# Patient Record
Sex: Male | Born: 1970 | Race: White | Hispanic: No | Marital: Married | Smoking: Former smoker
Health system: Southern US, Community
[De-identification: ages and names within clinical notes are randomized; demographics above are authoritative.]

## PROBLEM LIST (undated history)

## (undated) DIAGNOSIS — I519 Heart disease, unspecified: Secondary | ICD-10-CM

## (undated) DIAGNOSIS — R030 Elevated blood-pressure reading, without diagnosis of hypertension: Secondary | ICD-10-CM

## (undated) DIAGNOSIS — R109 Unspecified abdominal pain: Secondary | ICD-10-CM

## (undated) DIAGNOSIS — R319 Hematuria, unspecified: Secondary | ICD-10-CM

## (undated) DIAGNOSIS — E119 Type 2 diabetes mellitus without complications: Secondary | ICD-10-CM

## (undated) DIAGNOSIS — R3129 Other microscopic hematuria: Secondary | ICD-10-CM

## (undated) DIAGNOSIS — Z8719 Personal history of other diseases of the digestive system: Secondary | ICD-10-CM

## (undated) DIAGNOSIS — R350 Frequency of micturition: Secondary | ICD-10-CM

## (undated) DIAGNOSIS — G4730 Sleep apnea, unspecified: Secondary | ICD-10-CM

## (undated) DIAGNOSIS — S43439A Superior glenoid labrum lesion of unspecified shoulder, initial encounter: Secondary | ICD-10-CM

## (undated) DIAGNOSIS — Z87442 Personal history of urinary calculi: Secondary | ICD-10-CM

## (undated) HISTORY — DX: Superior glenoid labrum lesion of unspecified shoulder, initial encounter: S43.439A

## (undated) HISTORY — PX: TONSILLECTOMY: SUR1361

## (undated) HISTORY — DX: Type 2 diabetes mellitus without complications: E11.9

## (undated) HISTORY — DX: Other microscopic hematuria: R31.29

## (undated) HISTORY — PX: OTHER SURGICAL HISTORY: SHX169

## (undated) HISTORY — DX: Frequency of micturition: R35.0

## (undated) HISTORY — PX: NASAL SINUS SURGERY: SHX719

## (undated) HISTORY — DX: Personal history of urinary calculi: Z87.442

## (undated) HISTORY — DX: Elevated blood-pressure reading, without diagnosis of hypertension: R03.0

## (undated) HISTORY — DX: Heart disease, unspecified: I51.9

## (undated) HISTORY — DX: Unspecified abdominal pain: R10.9

## (undated) HISTORY — PX: SHOULDER SURGERY: SHX246

## (undated) HISTORY — DX: Sleep apnea, unspecified: G47.30

## (undated) HISTORY — DX: Hematuria, unspecified: R31.9

---

## 2007-02-22 ENCOUNTER — Ambulatory Visit: Payer: Self-pay | Admitting: Internal Medicine

## 2012-04-10 ENCOUNTER — Ambulatory Visit: Payer: Self-pay | Admitting: Internal Medicine

## 2013-01-02 ENCOUNTER — Ambulatory Visit: Payer: Self-pay | Admitting: Internal Medicine

## 2014-07-30 ENCOUNTER — Ambulatory Visit: Payer: Self-pay | Admitting: Internal Medicine

## 2014-08-05 DIAGNOSIS — S43439A Superior glenoid labrum lesion of unspecified shoulder, initial encounter: Secondary | ICD-10-CM | POA: Insufficient documentation

## 2014-08-05 DIAGNOSIS — M65812 Other synovitis and tenosynovitis, left shoulder: Secondary | ICD-10-CM | POA: Insufficient documentation

## 2014-08-05 HISTORY — DX: Superior glenoid labrum lesion of unspecified shoulder, initial encounter: S43.439A

## 2014-08-15 ENCOUNTER — Ambulatory Visit: Payer: Self-pay | Admitting: Surgery

## 2014-09-07 ENCOUNTER — Emergency Department
Admit: 2014-09-07 | Disposition: A | Discharge status: Home / Self Care | Payer: Self-pay | Admitting: Emergency Medicine

## 2014-09-07 LAB — COMPREHENSIVE METABOLIC PANEL
ALBUMIN: 4.6 g/dL
ANION GAP: 11 (ref 7–16)
Alkaline Phosphatase: 45 U/L
BILIRUBIN TOTAL: 0.7 mg/dL
BUN: 15 mg/dL
Calcium, Total: 9.7 mg/dL
Chloride: 101 mmol/L
Co2: 27 mmol/L
Creatinine: 1.06 mg/dL
EGFR (Non-African Amer.): >60
Glucose: 146 mg/dL — ABNORMAL HIGH
Potassium: 3.8 mmol/L
SGOT(AST): 35 U/L
SGPT (ALT): 41 U/L
Sodium: 139 mmol/L
TOTAL PROTEIN: 7.8 g/dL

## 2014-09-07 LAB — CBC WITH DIFFERENTIAL/PLATELET
Basophil #: 0.1 10*3/uL (ref 0.0–0.1)
Basophil %: 0.7 %
EOS ABS: 0.3 10*3/uL (ref 0.0–0.7)
EOS PCT: 3.1 %
HCT: 43.2 % (ref 40.0–52.0)
HGB: 15.3 g/dL (ref 13.0–18.0)
Lymphocyte #: 3.2 10*3/uL (ref 1.0–3.6)
Lymphocyte %: 33.7 %
MCH: 33.1 pg (ref 26.0–34.0)
MCHC: 35.4 g/dL (ref 32.0–36.0)
MCV: 94 fL (ref 80–100)
MONO ABS: 0.7 x10 3/mm (ref 0.2–1.0)
Monocyte %: 6.9 %
NEUTROS PCT: 55.6 %
Neutrophil #: 5.3 10*3/uL (ref 1.4–6.5)
PLATELETS: 205 10*3/uL (ref 150–440)
RBC: 4.61 10*6/uL (ref 4.40–5.90)
RDW: 12.9 % (ref 11.5–14.5)
WBC: 9.5 10*3/uL (ref 3.8–10.6)

## 2014-09-07 LAB — TROPONIN I: Troponin-I: <0.03 ng/mL

## 2014-09-07 LAB — URINALYSIS, COMPLETE
BACTERIA: NONE SEEN
BILIRUBIN, UR: NEGATIVE
Blood: NEGATIVE
GLUCOSE, UR: NEGATIVE mg/dL (ref 0–75)
Ketone: NEGATIVE
LEUKOCYTE ESTERASE: NEGATIVE
Nitrite: NEGATIVE
Ph: 5 (ref 4.5–8.0)
Protein: NEGATIVE
SQUAMOUS EPITHELIAL: NONE SEEN
Specific Gravity: 1.019 (ref 1.003–1.030)

## 2014-09-07 LAB — LIPASE, BLOOD: LIPASE: 27 U/L

## 2014-09-22 NOTE — Consult Note (Signed)
PATIENT NAME:  Gerald Myers, Gerald Myers MR#:  [id number] DATE OF BIRTH:  June 05, 1970  DATE OF CONSULTATION:  09/07/2014  REFERRING PHYSICIAN:   CONSULTING PHYSICIAN:  Carmie End, MD  BRIEF HISTORY:  Gerald Myers is a 44 year old gentleman seen in the Emergency Room with a 4-day history of abdominal pain and pleuritic chest.  The patient is 3 weeks status post a left shoulder repair by Dr. Joice Lofts from the [facility].  He had a gradual onset of right upper quadrant discomfort starting 4 days ago which worsened over the last 24 hours.  He has had one significant episode of nausea and vomiting.  Otherwise he has been able to eat.  He has normal bowel function.  No other GI symptoms.  He has not had this particular pain before. He has noted increase in his pleuritic discomfort and some mild shortness of breath.  He presented to the Emergency Room for further evaluation.  Workup in the ED with CT of the chest and abdomen demonstrated no evidence for pulmonary embolus or pleural effusion.  He did have what appeared to be some stranding between the stomach and the liver suggestive of possible omental infarction.  Ultrasound demonstrated an echogenic defect consistent with possible gallstone.  There was no evidence of acute cholecystitis.  No sonographic Murphy's sign.  No evidence of any gallbladder wall thickening.  His lab values were largely unremarkable.   He denies any other significant GI history other than the fact that he carries a diagnosis of diverticulitis.  He was diagnosed greater than 10 years ago with diverticulitis and told that he will always have that problem.  I wonder if he did not really mean diverticulosis.  He certainly did not have any history of recent intervention for diverticulitis.  Specifically denies no history of hepatitis, yellow jaundice, pancreatitis, peptic ulcer disease or gallbladder disease.  He has had no previous abdominal surgery.  His other surgeries in  addition to his left shoulder were tonsil and adenoidectomy, wisdom teeth, and nasal surgery.  He is a long-standing diabetic currently on oral medications.  He does not have a history of cardiac disease, hypertension or thyroid problems.   CURRENT MEDICATIONS: Alprazolam 0.5 mg twice a day p.r.n., Bupropion 150 mg twice a day, ibuprofen 200 mg 1-2 tablets every 6 hours p.r.n., oxycodone 5 mg two tablets every 4 to 6 hours p.r.n., metformin 1000 mg once a day, and TriCor 145 mg once a day.   ALLERGIES:  ASTELIN NASAL SPRAY.   SOCIAL HISTORY:  He is a reformed cigarette smoker.  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   FAMILY HISTORY:  Consistent with multiple cancers, diabetes and coronary artery disease.   REVIEW OF SYSTEMS: Otherwise unremarkable.  A 10-point review of systems is carried out and he has no other symptoms than those noted above.  Specifically he denies any palpitations, lightheadedness, or urinary symptoms.   PHYSICAL EXAMINATION:  GENERAL:  He is lying comfortably in bed.  Appears to be without significant pain although has had pain medication.  VITAL SIGNS:  Blood pressure is 135/80.  Heart rate is 78 and regular.  He is afebrile.  HEENT:  Normal eyes.  Normal ears without evidence of scleral icterus or pupillary abnormalities. LYMPH:  No lymphadenitis in his axilla or cervical areas.  CHEST:  Clear bilaterally with no adventitious sounds.  He has normal pulmonary excursion with some pain on deep inspiration.  CARDIAC:  No murmurs or  gallops.  He seems to be in normal sinus rhythm.  ABDOMEN:  Very soft with no significant abdominal tenderness.  He has no rebound, guarding, masses or hernias noted.  EXTREMITIES:  Full range of motion.  Upper extremity exam has significant decreased range of motion in his left arm following his surgery.  NEUROLOGIC:  Compromised by his recent surgery.  SKIN:  No significant lesions, abrasions or  contusions.  PSYCHIATRIC:  Normal orientation, normal affect.    ASSESSMENT AND PLAN:  I have independently reviewed his CT scan and his ultrasound report.  CT scan does show some wispy stranding between the stomach and his liver although I think it is difficult to make the diagnosis of omental infarction in that situation.  He does have a gallstone so some of his symptoms could be related to biliary tract disease.  I think his current symptoms, if related to omental infarction, should be short-lived and self-limited.  Would recommend symptomatic treatment without antibiotic therapy at the present time. Would  recommend followup with [facility], Dr. Daniel NonesBert Klein as there are no significant indications for intervention with omental infarction.  Should he begin to have symptoms we would re-evaluate his gallbladder perhaps with HIDA scan to see if his symptoms are related to his biliary tract.  I have outlined this plan to the patient and his wife and they are in agreement.      ____________________________ Carmie Endalph L. Ely III, MD [phone] D: 09/07/2014 21:29:50 ET T: 09/07/2014 21:57:11 ET JOB#: [id number]  cc: Carmie Endalph L. Ely III, MD, <Dictator> Lynnea FerrierBert J. Klein III, MD Quentin OreALPH L ELY MD ELECTRONICALLY SIGNED 09/08/2014 22:15

## 2014-09-22 NOTE — Op Note (Signed)
PATIENT NAME:  Gerald Myers, Gerald Myers MR#:  [id number] DATE OF BIRTH:  03-13-1971  DATE OF PROCEDURE:  08/15/2014  PREOPERATIVE DIAGNOSIS: Impingement/tendinopathy with superior labrum anterior and posterior tear, left shoulder.   POSTOPERATIVE DIAGNOSIS: Impingement/tendinopathy with superior labrum anterior and posterior tear, left shoulder.   PROCEDURES: Arthroscopic superior labrum anterior and posterior repair, arthroscopic subacromial decompression, and mini-open biceps tenodesis, left shoulder.  SURGEON: Maryagnes Amos, MD  ANESTHESIA: General endotracheal with an interscalene block placed preoperative by the anesthesiologist.   FINDINGS: As noted above. There was a superior labral tear extending from approximately the 11:00 to the 12:30 position with significant degenerative fraying. The rotator cuff itself was in excellent condition, as were the articular surfaces of the glenoid and humerus.   COMPLICATIONS: None.   ESTIMATED BLOOD LOSS: Minimal.  TOTAL FLUIDS: Crystalloid 800 mL.  TOURNIQUET: None.   DRAINS: None.  CLOSURE: Staples.  BRIEF CLINICAL NOTE: The patient is a 44 year old male with a several month history of left shoulder pain. His symptoms have progressed despite medications, activity modification, etc. His history and examination were consistent with an impingement/tendinopathy and SLAP tear, all of which were confirmed by MRI scan. He presents at this time for arthroscopy, debridement, SLAP repair, and decompression.  PROCEDURE: The patient underwent placement of an interscalene block in the preoperative holding area. He was then brought into the operating room and lain in the supine position. After adequate general endotracheal intubation and anesthesia were obtained, he was repositioned in the beach chair position using the beach chair positioner. The left shoulder and upper extremity were prepped with ChloraPrep solution before being draped sterilely.  Preoperative antibiotics were administered. The expected portal sites and incision site were injected with 1% lidocaine with epinephrine before the camera was placed in the posterior portal. The glenohumeral joint was thoroughly inspected with the findings as described above. An anterior portal was created using an outside-in technique. The labrum was carefully probed, again confirming the above-noted findings. Given that we were going to do a SLAP repair, it was felt best to proceed with a biceps tenodesis. Therefore, the biceps was released from its labral attachment using the ArthroCare wand. A moderate amount of reactive inflamed synovial tissues superiorly and anteriorly were debrided using the full radius resector and with the ArthroCare wand. A separate superolateral portal site was created using an outside-in technique in order to facilitate repair of the superior labrum. The exposed glenoid rim was roughened with an end-cutting rasp and full-radius resector down to bleeding bone. The labrum was repaired using a single BioKnotless anchor placed at approximately the 12 o'clock position. Subsequent probing of the repair demonstrated excellent stability. The instruments were removed from the joint after suctioning the excess fluid.   The camera was repositioned through the posterior portal into the subacromial space. A separate lateral portal was created. The full-radius resector was introduced and used to perform a subtotal bursectomy before the ArthroCare wand was introduced to remove the periosteum off the undersurface of the anterior third of the acromion as well as to recess the coracoacromial ligament from its attachment along the anterior and lateral margin of the acromion. The 4 mm acromionizing bur was inserted and used to complete the decompression by removing the undersurface of the anterior third of the acromion. The full radius resector was reintroduced to remove any residual bony debris before  the ArthroCare wand was reintroduced to obtain hemostasis. The instruments were then removed from the subacromial space after suctioning the excess fluid.  The superolateral portal site was extended for a total of approximately 5 cm, parallel to the bicipital groove. The incision was carried down through the subcutaneous tissues to expose the deltoid fascia. The raphe between the anterior and middle thirds was identified and this plane developed to provide access into the subacromial space. The bicipital groove was identified by palpation and opened. The biceps tendon stump was retrieved through this defect. The floor of the bicipital groove was roughened with a curette before a single Biomet 2.9 mm JuggerKnot anchor was inserted. Both sets of sutures were passed through the tendon and tied securely to effect the tenodesis. The bicipital sheath was reapproximated using two #0 Ethibond interrupted sutures, incorporating the biceps tendon to further reinforce the tenodesis.   The wound was copiously irrigated with sterile saline solution before the deltoid raphe was reapproximated using 2-0 Vicryl interrupted sutures. The subcutaneous tissues were closed in two layers using 2-0 Vicryl interrupted sutures before the skin was closed using staples. The portal sites also were closed using staples. A sterile bulky dressing was applied to the shoulder before the arm was placed into a shoulder immobilizer. The patient was then awakened, extubated, and returned to the recovery room in satisfactory condition after tolerating the procedure well.   ____________________________ J. Derald MacleodJeffrey Aalijah Lanphere, MD jjp:ST D: 08/15/2014 11:27:28 ET T: 08/15/2014 23:00:54 ET JOB#: [id number]  cc: Maryagnes AmosJ. Jeffrey Okie Bogacz, MD, <Dictator> Maryagnes AmosJ. JEFFREY Jaydien Panepinto MD ELECTRONICALLY SIGNED 08/20/2014 16:03

## 2014-12-16 DIAGNOSIS — E119 Type 2 diabetes mellitus without complications: Secondary | ICD-10-CM

## 2014-12-16 HISTORY — DX: Type 2 diabetes mellitus without complications: E11.9

## 2015-05-19 ENCOUNTER — Emergency Department
Admission: EM | Admit: 2015-05-19 | Discharge: 2015-05-19 | Disposition: A | Discharge status: Home / Self Care | Payer: Federal, State, Local not specified - PPO | Attending: Emergency Medicine | Admitting: Emergency Medicine

## 2015-05-19 ENCOUNTER — Emergency Department: Payer: Federal, State, Local not specified - PPO

## 2015-05-19 DIAGNOSIS — N50811 Right testicular pain: Secondary | ICD-10-CM | POA: Diagnosis present

## 2015-05-19 DIAGNOSIS — N23 Unspecified renal colic: Secondary | ICD-10-CM | POA: Diagnosis not present

## 2015-05-19 LAB — URINALYSIS COMPLETE WITH MICROSCOPIC (ARMC ONLY)
Bilirubin Urine: NEGATIVE
GLUCOSE, UA: 150 mg/dL — AB
LEUKOCYTES UA: NEGATIVE
NITRITE: NEGATIVE
PROTEIN: 30 mg/dL — AB
SPECIFIC GRAVITY, URINE: 1.029 (ref 1.005–1.030)
pH: 5 (ref 5.0–8.0)

## 2015-05-19 LAB — COMPREHENSIVE METABOLIC PANEL
ALT: 24 U/L (ref 17–63)
ANION GAP: 8 (ref 5–15)
AST: 19 U/L (ref 15–41)
Albumin: 4.4 g/dL (ref 3.5–5.0)
Alkaline Phosphatase: 42 U/L (ref 38–126)
BILIRUBIN TOTAL: 0.6 mg/dL (ref 0.3–1.2)
BUN: 18 mg/dL (ref 6–20)
CO2: 24 mmol/L (ref 22–32)
Calcium: 9 mg/dL (ref 8.9–10.3)
Chloride: 105 mmol/L (ref 101–111)
Creatinine, Ser: 1.15 mg/dL (ref 0.61–1.24)
Glucose, Bld: 249 mg/dL — ABNORMAL HIGH (ref 65–99)
POTASSIUM: 4 mmol/L (ref 3.5–5.1)
Sodium: 137 mmol/L (ref 135–145)
TOTAL PROTEIN: 7.1 g/dL (ref 6.5–8.1)

## 2015-05-19 LAB — CBC
HCT: 39.7 % — ABNORMAL LOW (ref 40.0–52.0)
Hemoglobin: 14.1 g/dL (ref 13.0–18.0)
MCH: 32.9 pg (ref 26.0–34.0)
MCHC: 35.4 g/dL (ref 32.0–36.0)
MCV: 92.9 fL (ref 80.0–100.0)
Platelets: 186 10*3/uL (ref 150–440)
RBC: 4.27 MIL/uL — ABNORMAL LOW (ref 4.40–5.90)
RDW: 13.2 % (ref 11.5–14.5)
WBC: 10 10*3/uL (ref 3.8–10.6)

## 2015-05-19 LAB — LIPASE, BLOOD: Lipase: 17 U/L (ref 11–51)

## 2015-05-19 MED ORDER — ONDANSETRON HCL 4 MG/2ML IJ SOLN
4.0000 mg | Freq: Once | INTRAMUSCULAR | Status: AC | PRN
  Administered 2015-05-19: 4 mg via INTRAVENOUS
  Filled 2015-05-19: qty 2

## 2015-05-19 MED ORDER — MORPHINE SULFATE (PF) 4 MG/ML IV SOLN
INTRAVENOUS | Status: AC
  Administered 2015-05-19: 4 mg via INTRAVENOUS
  Filled 2015-05-19: qty 1

## 2015-05-19 MED ORDER — ONDANSETRON HCL 4 MG PO TABS: 4.0000 mg | ORAL_TABLET | Freq: Every day | ORAL | Status: DC | PRN

## 2015-05-19 MED ORDER — OXYCODONE-ACETAMINOPHEN 5-325 MG PO TABS: 2.0000 | ORAL_TABLET | Freq: Four times a day (QID) | ORAL | Status: DC | PRN

## 2015-05-19 MED ORDER — SODIUM CHLORIDE 0.9 % IV BOLUS (SEPSIS)
1000.0000 mL | Freq: Once | INTRAVENOUS | Status: AC
  Administered 2015-05-19: 1000 mL via INTRAVENOUS

## 2015-05-19 MED ORDER — MORPHINE SULFATE (PF) 4 MG/ML IV SOLN
4.0000 mg | Freq: Once | INTRAVENOUS | Status: AC
  Administered 2015-05-19: 4 mg via INTRAVENOUS

## 2015-05-19 MED ORDER — TAMSULOSIN HCL 0.4 MG PO CAPS: 0.4000 mg | ORAL_CAPSULE | Freq: Every day | ORAL | Status: DC

## 2015-05-19 MED ORDER — HYDROMORPHONE HCL 1 MG/ML IJ SOLN
0.5000 mg | Freq: Once | INTRAMUSCULAR | Status: AC
  Administered 2015-05-19: 0.5 mg via INTRAVENOUS
  Filled 2015-05-19: qty 1

## 2015-05-19 NOTE — Discharge Instructions (Signed)
Kidney Stones °Kidney stones (urolithiasis) are deposits that form inside your kidneys. The intense pain is caused by the stone moving through the urinary tract. When the stone moves, the ureter goes into spasm around the stone. The stone is usually passed in the urine.  °CAUSES  °· A disorder that makes certain neck glands produce too much parathyroid hormone (primary hyperparathyroidism). °· A buildup of uric acid crystals, similar to gout in your joints. °· Narrowing (stricture) of the ureter. °· A kidney obstruction present at birth (congenital obstruction). °· Previous surgery on the kidney or ureters. °· Numerous kidney infections. °SYMPTOMS  °· Feeling sick to your stomach (nauseous). °· Throwing up (vomiting). °· Blood in the urine (hematuria). °· Pain that usually spreads (radiates) to the groin. °· Frequency or urgency of urination. °DIAGNOSIS  °· Taking a history and physical exam. °· Blood or urine tests. °· CT scan. °· Occasionally, an examination of the inside of the urinary bladder (cystoscopy) is performed. °TREATMENT  °· Observation. °· Increasing your fluid intake. °· Extracorporeal shock wave lithotripsy--This is a noninvasive procedure that uses shock waves to break up kidney stones. °· Surgery may be needed if you have severe pain or persistent obstruction. There are various surgical procedures. Most of the procedures are performed with the use of small instruments. Only small incisions are needed to accommodate these instruments, so recovery time is minimized. °The size, location, and chemical composition are all important variables that will determine the proper choice of action for you. Talk to your health care provider to better understand your situation so that you will minimize the risk of injury to yourself and your kidney.  °HOME CARE INSTRUCTIONS  °· Drink enough water and fluids to keep your urine clear or pale yellow. This will help you to pass the stone or stone fragments. °· Strain  all urine through the provided strainer. Keep all particulate matter and stones for your health care provider to see. The stone causing the pain may be as small as a grain of salt. It is very important to use the strainer each and every time you pass your urine. The collection of your stone will allow your health care provider to analyze it and verify that a stone has actually passed. The stone analysis will often identify what you can do to reduce the incidence of recurrences. °· Only take over-the-counter or prescription medicines for pain, discomfort, or fever as directed by your health care provider. °· Keep all follow-up visits as told by your health care provider. This is important. °· Get follow-up X-rays if required. The absence of pain does not always mean that the stone has passed. It may have only stopped moving. If the urine remains completely obstructed, it can cause loss of kidney function or even complete destruction of the kidney. It is your responsibility to make sure X-rays and follow-ups are completed. Ultrasounds of the kidney can show blockages and the status of the kidney. Ultrasounds are not associated with any radiation and can be performed easily in a matter of minutes. °· Make changes to your daily diet as told by your health care provider. You may be told to: °¨ Limit the amount of salt that you eat. °¨ Eat 5 or more servings of fruits and vegetables each day. °¨ Limit the amount of meat, poultry, fish, and eggs that you eat. °· Collect a 24-hour urine sample as told by your health care provider. You may need to collect another urine sample every 6-12   months. °SEEK MEDICAL CARE IF: °· You experience pain that is progressive and unresponsive to any pain medicine you have been prescribed. °SEEK IMMEDIATE MEDICAL CARE IF:  °· Pain cannot be controlled with the prescribed medicine. °· You have a fever or shaking chills. °· The severity or intensity of pain increases over 18 hours and is not  relieved by pain medicine. °· You develop a new onset of abdominal pain. °· You feel faint or pass out. °· You are unable to urinate. °  °This information is not intended to replace advice given to you by your health care provider. Make sure you discuss any questions you have with your health care provider. °  °Document Released: 05/10/2005 Document Revised: 01/29/2015 Document Reviewed: 10/11/2012 °Elsevier Interactive Patient Education ©2016 [employer] ° °

## 2015-05-19 NOTE — ED Provider Notes (Signed)
[facility] Emergency Department Provider Note     Time seen: ----------------------------------------- 7:12 AM on 05/19/2015 -----------------------------------------    I have reviewed the triage vital signs and the nursing notes.   HISTORY  Chief Complaint Back Pain; Emesis; and Testicle Pain    HPI Vara GuardianChristopher W Vivona is a 44 y.o. male 's ER for sudden onset right mid back pain and testicular pain. Patient denies any urinary symptoms, he does have a history kidney stones states this feels similar.Patient states in the past he was able to pass kidney stone, denies any other complaints.   No past medical history on file.  There are no active problems to display for this patient.   No past surgical history on file.  Allergies Review of patient's allergies indicates no known allergies.  Social History Social History  Substance Use Topics  . Smoking status: Not on file  . Smokeless tobacco: Not on file  . Alcohol Use: Not on file    Review of Systems Constitutional: Negative for fever. Eyes: Negative for visual changes. ENT: Negative for sore throat. Cardiovascular: Negative for chest pain. Respiratory: Negative for shortness of breath. Gastrointestinal: Positive right flank pain and nausea Genitourinary: Negative for dysuria. Musculoskeletal: Positive for back pain Skin: Negative for rash. Neurological: Negative for headaches, focal weakness or numbness.  10-point ROS otherwise negative.  ____________________________________________   PHYSICAL EXAM:  VITAL SIGNS: ED Triage Vitals  Enc Vitals Group     BP 05/19/15 0604 140/75 mmHg     Pulse Rate 05/19/15 0604 66     Resp 05/19/15 0604 18     Temp 05/19/15 0604 97.9 F (36.6 C)     Temp Source 05/19/15 0604 Oral     SpO2 05/19/15 0604 98 %     Weight 05/19/15 0604 235 lb (106.595 kg)     Height 05/19/15 0604 5' 6.5" (1.689 m)     Head Cir --      Peak Flow --    Pain Score 05/19/15 0604 9     Pain Loc --      Pain Edu? --      Excl. in GC? --     Constitutional: Alert and oriented. Mild distress Eyes: Conjunctivae are normal. PERRL. Normal extraocular movements. ENT   Head: Normocephalic and atraumatic.   Nose: No congestion/rhinnorhea.   Mouth/Throat: Mucous membranes are moist.   Neck: No stridor. Cardiovascular: Normal rate, regular rhythm. No murmurs, rubs, or gallops. Respiratory: Normal respiratory effort without tachypnea nor retractions. Breath sounds are clear and equal bilaterally. No wheezes/rales/rhonchi. Gastrointestinal: No flank tenderness, no rebound or guarding. Normal bowel sounds. Musculoskeletal: Nontender with normal range of motion in all extremities. No joint effusions.  No lower extremity tenderness nor edema. Neurologic:  Normal speech and language. No gross focal neurologic deficits are appreciated. Speech is normal.  Skin:  Skin is warm, dry and intact. No rash noted. Psychiatric: Mood and affect are normal. Speech and behavior are normal. Patient exhibits appropriate insight and judgment. ____________________________________________  ED COURSE:  Pertinent labs & imaging results that were available during my care of the patient were reviewed by me and considered in my medical decision making (see chart for details). Likely renal colic. Patient received IV Toradol and fluids. ____________________________________________    LABS (pertinent positives/negatives)  Labs Reviewed  COMPREHENSIVE METABOLIC PANEL - Abnormal; Notable for the following:    Glucose, Bld 249 (*)    All other components within normal limits  CBC - Abnormal;  Notable for the following:    RBC 4.27 (*)    HCT 39.7 (*)    All other components within normal limits  URINALYSIS COMPLETEWITH MICROSCOPIC ([facility] ONLY) - Abnormal; Notable for the following:    Color, Urine YELLOW (*)    APPearance HAZY (*)    Glucose, UA 150 (*)     Ketones, ur TRACE (*)    Hgb urine dipstick 3+ (*)    Protein, ur 30 (*)    Bacteria, UA RARE (*)    Squamous Epithelial / LPF 0-5 (*)    All other components within normal limits  LIPASE, BLOOD    RADIOLOGY Images were viewed by me  KUB IMPRESSION: No acute abnormality identified. IMPRESSION: 1. Partially obstructing 7 mm proximal right ureteral calculus. 2. Descending and sigmoid diverticulosis. ____________________________________________  FINAL ASSESSMENT AND PLAN  Renal colic  Plan: Patient with labs and imaging as dictated above. Patient with a large proximal right ureteral stone. He'll be discharged with pain medicine, Flomax and antiemetics. He is stable for outpatient follow-up with his doctor.   Emily Filbert, MD   Emily Filbert, MD 05/19/15 [phone]

## 2015-05-19 NOTE — ED Notes (Signed)
Pt c/o sudden onset of right mid back pain and testicular pain; denies urinary s/s; pt with history of kidney stones and says this feels similar; pt restless in triage

## 2015-05-19 NOTE — ED Notes (Signed)
Pt alert and oriented. NAD. Respirations unlabored. Pain is tolerable but slightly starting to increase. Will attempt to obtain urine. Skin warm dry and pink.

## 2015-05-19 NOTE — ED Notes (Signed)
Patient transported to CT 

## 2015-05-20 ENCOUNTER — Encounter: Payer: Self-pay | Admitting: Urology

## 2015-05-22 ENCOUNTER — Encounter: Payer: Self-pay | Admitting: Urology

## 2015-05-22 ENCOUNTER — Ambulatory Visit (INDEPENDENT_AMBULATORY_CARE_PROVIDER_SITE_OTHER): Payer: Federal, State, Local not specified - PPO | Admitting: Urology

## 2015-05-22 VITALS — BP 141/88 | HR 111 | Ht 67.0 in | Wt 238.9 lb

## 2015-05-22 DIAGNOSIS — R030 Elevated blood-pressure reading, without diagnosis of hypertension: Secondary | ICD-10-CM | POA: Insufficient documentation

## 2015-05-22 DIAGNOSIS — N201 Calculus of ureter: Secondary | ICD-10-CM | POA: Diagnosis not present

## 2015-05-22 DIAGNOSIS — E785 Hyperlipidemia, unspecified: Secondary | ICD-10-CM | POA: Insufficient documentation

## 2015-05-22 DIAGNOSIS — G4730 Sleep apnea, unspecified: Secondary | ICD-10-CM | POA: Insufficient documentation

## 2015-05-22 DIAGNOSIS — R109 Unspecified abdominal pain: Secondary | ICD-10-CM | POA: Diagnosis not present

## 2015-05-22 DIAGNOSIS — R3129 Other microscopic hematuria: Secondary | ICD-10-CM

## 2015-05-22 DIAGNOSIS — F329 Major depressive disorder, single episode, unspecified: Secondary | ICD-10-CM | POA: Insufficient documentation

## 2015-05-22 DIAGNOSIS — F32A Depression, unspecified: Secondary | ICD-10-CM | POA: Insufficient documentation

## 2015-05-22 HISTORY — DX: Other microscopic hematuria: R31.29

## 2015-05-22 HISTORY — DX: Elevated blood-pressure reading, without diagnosis of hypertension: R03.0

## 2015-05-22 HISTORY — DX: Sleep apnea, unspecified: G47.30

## 2015-05-22 HISTORY — DX: Unspecified abdominal pain: R10.9

## 2015-05-22 LAB — MICROSCOPIC EXAMINATION: EPITHELIAL CELLS (NON RENAL): NONE SEEN /HPF (ref 0–10)

## 2015-05-22 LAB — URINALYSIS, COMPLETE
BILIRUBIN UA: NEGATIVE
Glucose, UA: NEGATIVE
Leukocytes, UA: NEGATIVE
NITRITE UA: NEGATIVE
PH UA: 6 (ref 5.0–7.5)
SPEC GRAV UA: 1.025 (ref 1.005–1.030)
Urobilinogen, Ur: 1 mg/dL (ref 0.2–1.0)

## 2015-05-22 MED ORDER — OXYCODONE-ACETAMINOPHEN 5-325 MG PO TABS: 2.0000 | ORAL_TABLET | Freq: Four times a day (QID) | ORAL | Status: DC | PRN

## 2015-05-22 MED ORDER — TAMSULOSIN HCL 0.4 MG PO CAPS: 0.4000 mg | ORAL_CAPSULE | Freq: Every day | ORAL | Status: AC

## 2015-05-22 NOTE — Progress Notes (Signed)
05/22/2015 3:56 PM   Gerald Myers January 20, 1971 [id number]  Referring provider: Adin Hector, MD [address], [place] [address]  Chief Complaint  Patient presents with  . Nephrolithiasis    New Patient    HPI: Gerald Myers is a 44yo seen in consultation for right ureteral calculi. 1 month ago he had dull intermittent right back pain. 3 days ago he awoke to severe, intermittent, sharp, nonradiating right flank pain. He went to the ER and was diagnosed with a 33m R mid ureteral calculi. He is on percocet, flomax. His pain is a 2-3/10. He denies any LUTS.    PMH: Past Medical History  Diagnosis Date  . Heart disease   . Hematuria   . Diabetes ([place]   . Urinary frequency   . History of kidney stones     Surgical History: Past Surgical History  Procedure Laterality Date  . Tonsillectomy    . Tubes in ear    . Shoulder surgery Left   . Nasal sinus surgery      Home Medications:    Medication List       This list is accurate as of: 05/22/15  3:56 PM.  Always use your most recent med list.               ALPRAZolam 0.5 MG tablet  Commonly known as:  XANAX  Take 1 tablet by mouth 2 (two) times daily as needed. Reported on 05/22/2015     buPROPion 150 MG 12 hr tablet  Commonly known as:  WELLBUTRIN SR  Take 1 tablet by mouth 2 (two) times daily.     fenofibrate 145 MG tablet  Commonly known as:  TRICOR  Take 1 tablet by mouth daily.     fluticasone 50 MCG/ACT nasal spray  Commonly known as:  FLONASE  Place 2 sprays into both nostrils daily.     ibuprofen 200 MG tablet  Commonly known as:  ADVIL,MOTRIN  Take 200 mg by mouth every 6 (six) hours as needed.     metFORMIN 1000 MG tablet  Commonly known as:  GLUCOPHAGE  Take 1,000 mg by mouth 2 (two) times daily with a meal.     ondansetron 4 MG tablet  Commonly known as:  ZOFRAN  Take 1 tablet (4 mg total) by mouth daily as needed for nausea or vomiting.     oxyCODONE-acetaminophen 5-325 MG tablet  Commonly known as:  PERCOCET  Take 2 tablets by mouth every 6 (six) hours as needed for moderate pain or severe pain.     tamsulosin 0.4 MG Caps capsule  Commonly known as:  FLOMAX  Take 1 capsule (0.4 mg total) by mouth daily after breakfast.        Allergies: No Known Allergies  Family History: Family History  Problem Relation Age of Onset  . Bladder Cancer Neg Hx   . Prostate cancer Neg Hx   . Kidney cancer Neg Hx   . Nephrolithiasis Father     Social History:  reports that he has been smoking Cigarettes.  He has been smoking about 0.50 packs per day. He does not have any smokeless tobacco history on file. He reports that he drinks alcohol. He reports that he does not use illicit drugs.  ROS: UROLOGY Frequent Urination?: No Hard to postpone urination?: No Burning/pain with urination?: No Get up at night to urinate?: No Leakage of urine?: No Urine stream starts and stops?: No Trouble  starting stream?: No Do you have to strain to urinate?: No Blood in urine?: Yes Urinary tract infection?: No Sexually transmitted disease?: No Injury to kidneys or bladder?: No Painful intercourse?: No Weak stream?: No Erection problems?: No Penile pain?: No  Gastrointestinal Nausea?: No Vomiting?: No Indigestion/heartburn?: No Diarrhea?: No Constipation?: No  Constitutional Fever: No Night sweats?: No Weight loss?: No Fatigue?: No  Skin Skin rash/lesions?: No Itching?: No  Eyes Blurred vision?: No Double vision?: No  Ears/Nose/Throat Sore throat?: No Sinus problems?: No  Hematologic/Lymphatic Swollen glands?: No Easy bruising?: No  Cardiovascular Leg swelling?: No Chest pain?: No  Respiratory Cough?: No Shortness of breath?: No  Endocrine Excessive thirst?: No  Musculoskeletal Back pain?: Yes Joint pain?: No  Neurological Headaches?: No Dizziness?: No  Psychologic Depression?: No Anxiety?:  No  Physical Exam: BP 141/88 mmHg  Pulse 111  Ht '5\' 7"'  (1.702 m)  Wt 108.364 kg (238 lb 14.4 oz)  BMI 37.41 kg/m2  Constitutional:  Alert and oriented, No acute distress. HEENT: [place] AT, moist mucus membranes.  Trachea midline, no masses. Cardiovascular: No clubbing, cyanosis, or edema. Respiratory: Normal respiratory effort, no increased work of breathing. GI: Abdomen is soft, nontender, nondistended, no abdominal masses GU: No CVA tenderness. Skin: No rashes, bruises or suspicious lesions. Lymph: No cervical or inguinal adenopathy. Neurologic: Grossly intact, no focal deficits, moving all 4 extremities. Psychiatric: Normal mood and affect.  Laboratory Data: Lab Results  Component Value Date   WBC 10.0 05/19/2015   HGB 14.1 05/19/2015   HCT 39.7* 05/19/2015   MCV 92.9 05/19/2015   PLT 186 05/19/2015    Lab Results  Component Value Date   CREATININE 1.15 05/19/2015    No results found for: PSA  No results found for: TESTOSTERONE  No results found for: HGBA1C  Urinalysis    Component Value Date/Time   COLORURINE YELLOW* 05/19/2015 0803   COLORURINE Yellow 09/07/2014 1425   APPEARANCEUR HAZY* 05/19/2015 0803   APPEARANCEUR Clear 09/07/2014 1425   LABSPEC 1.029 05/19/2015 0803   LABSPEC 1.019 09/07/2014 1425   PHURINE 5.0 05/19/2015 0803   PHURINE 5.0 09/07/2014 1425   GLUCOSEU 150* 05/19/2015 0803   GLUCOSEU Negative 09/07/2014 1425   HGBUR 3+* 05/19/2015 0803   HGBUR Negative 09/07/2014 1425   BILIRUBINUR NEGATIVE 05/19/2015 0803   BILIRUBINUR Negative 09/07/2014 1425   KETONESUR TRACE* 05/19/2015 0803   KETONESUR Negative 09/07/2014 1425   PROTEINUR 30* 05/19/2015 0803   PROTEINUR Negative 09/07/2014 1425   NITRITE NEGATIVE 05/19/2015 0803   NITRITE Negative 09/07/2014 1425   LEUKOCYTESUR NEGATIVE 05/19/2015 0803   LEUKOCYTESUR Negative 09/07/2014 1425    Pertinent Imaging: CT scan  Assessment & Plan:    1. Calculus of right ureter -MET, rx for  percocet, flomax -RTC 1 week - Urinalysis, Complete   No Follow-up on file.  Cleon Gustin, [facility] [address], [place], [place] [address] [phone]

## 2015-05-29 ENCOUNTER — Ambulatory Visit (INDEPENDENT_AMBULATORY_CARE_PROVIDER_SITE_OTHER): Payer: Federal, State, Local not specified - PPO | Admitting: Urology

## 2015-05-29 ENCOUNTER — Encounter: Payer: Self-pay | Admitting: Urology

## 2015-05-29 VITALS — BP 113/74 | HR 82 | Ht 67.0 in | Wt 238.3 lb

## 2015-05-29 DIAGNOSIS — N201 Calculus of ureter: Secondary | ICD-10-CM | POA: Diagnosis not present

## 2015-05-29 LAB — URINALYSIS, COMPLETE
Bilirubin, UA: NEGATIVE
KETONES UA: NEGATIVE
LEUKOCYTES UA: NEGATIVE
NITRITE UA: NEGATIVE
PROTEIN UA: NEGATIVE
SPEC GRAV UA: 1.025 (ref 1.005–1.030)
Urobilinogen, Ur: 0.2 mg/dL (ref 0.2–1.0)
pH, UA: 5.5 (ref 5.0–7.5)

## 2015-05-29 LAB — MICROSCOPIC EXAMINATION
RBC, UA: >30 /hpf — ABNORMAL HIGH (ref 0–?)
WBC, UA: NONE SEEN /hpf (ref 0–?)

## 2015-05-29 MED ORDER — OXYCODONE-ACETAMINOPHEN 5-325 MG PO TABS: 2.0000 | ORAL_TABLET | Freq: Four times a day (QID) | ORAL | Status: DC | PRN

## 2015-05-29 NOTE — Progress Notes (Signed)
05/29/2015 11:03 AM   Gerald Myers 03-22-1971 [id number]  Referring provider: Lynnea FerrierBert J Klein III, MD [address] [facility] [place], [place] [id number]  Chief Complaint  Patient presents with  . Follow-up    Nephrolithoasis    HPI: Mr Gerald JamesCaviness is a 44yo seen in consultation for right ureteral calculi. 1 month ago he had dull intermittent right back pain. 3 days ago he awoke to severe, intermittent, sharp, nonradiating right flank pain. He went to the ER and was diagnosed with a 5mm R mid ureteral calculi. He is on percocet, flomax. His pain is a 2-3/10. He denies any LUTS. The stone was not visible on KUB.  Interval history:   The patient has done well since his last appointment. He is taking less pain medication. He has not passed the stone. He has no fevers chills or signs of infection.   PMH: Past Medical History  Diagnosis Date  . Heart disease   . Hematuria   . Diabetes (HCC)   . Urinary frequency   . History of kidney stones   . Diabetes mellitus without complication (HCC) 12/16/2014  . Glenoid labral tear 08/05/2014  . Microscopic hematuria 05/22/2015    Overview:  prior history of renal stones. Renal Ultrasound 11/13 unremarkable   . Apnea, sleep 05/22/2015    Overview:  on CPAP   . Elevated blood pressure reading without diagnosis of hypertension 05/22/2015  . Flank pain 05/22/2015    Surgical History: Past Surgical History  Procedure Laterality Date  . Tonsillectomy    . Tubes in ear    . Shoulder surgery Left   . Nasal sinus surgery      Home Medications:    Medication List       This list is accurate as of: 05/29/15 11:03 AM.  Always use your most recent med list.               ALPRAZolam 0.5 MG tablet  Commonly known as:  XANAX  Take 1 tablet by mouth 2 (two) times daily as needed. Reported on 05/22/2015     buPROPion 150 MG 12 hr tablet  Commonly known as:  WELLBUTRIN SR  Take 1 tablet by mouth 2 (two) times daily.       fenofibrate 145 MG tablet  Commonly known as:  TRICOR  Take 1 tablet by mouth daily.     fluticasone 50 MCG/ACT nasal spray  Commonly known as:  FLONASE  Place 2 sprays into both nostrils daily.     ibuprofen 200 MG tablet  Commonly known as:  ADVIL,MOTRIN  Take 200 mg by mouth every 6 (six) hours as needed.     metFORMIN 1000 MG tablet  Commonly known as:  GLUCOPHAGE  Take 1,000 mg by mouth 2 (two) times daily with a meal.     ondansetron 4 MG tablet  Commonly known as:  ZOFRAN  Take 1 tablet (4 mg total) by mouth daily as needed for nausea or vomiting.     oxyCODONE-acetaminophen 5-325 MG tablet  Commonly known as:  PERCOCET  Take 2 tablets by mouth every 6 (six) hours as needed for moderate pain or severe pain.     tamsulosin 0.4 MG Caps capsule  Commonly known as:  FLOMAX  Take 1 capsule (0.4 mg total) by mouth daily after breakfast.        Allergies: No Known Allergies  Family History: Family History  Problem Relation Age of Onset  . Bladder  Cancer Neg Hx   . Prostate cancer Neg Hx   . Kidney cancer Neg Hx   . Nephrolithiasis Father     Social History:  reports that he has been smoking Cigarettes.  He has been smoking about 0.50 packs per day. He does not have any smokeless tobacco history on file. He reports that he drinks alcohol. He reports that he does not use illicit drugs.  ROS: UROLOGY Frequent Urination?: No Hard to postpone urination?: No Burning/pain with urination?: No Get up at night to urinate?: No Leakage of urine?: No Urine stream starts and stops?: No Trouble starting stream?: No Do you have to strain to urinate?: No Blood in urine?: No Urinary tract infection?: No Sexually transmitted disease?: No Injury to kidneys or bladder?: No Painful intercourse?: No Weak stream?: No Erection problems?: No Penile pain?: No  Gastrointestinal Nausea?: No Vomiting?: No Indigestion/heartburn?: No Diarrhea?: No Constipation?:  No  Constitutional Fever: No Night sweats?: No Weight loss?: No Fatigue?: No  Skin Skin rash/lesions?: No Itching?: No  Eyes Blurred vision?: No Double vision?: No  Ears/Nose/Throat Sore throat?: No Sinus problems?: No  Hematologic/Lymphatic Swollen glands?: No Easy bruising?: No  Cardiovascular Leg swelling?: No Chest pain?: No  Respiratory Cough?: No Shortness of breath?: No  Endocrine Excessive thirst?: No  Musculoskeletal Back pain?: Yes Joint pain?: No  Neurological Headaches?: No Dizziness?: No  Psychologic Depression?: No  Physical Exam: BP 113/74 mmHg  Pulse 82  Ht 5\' 7"  (1.702 m)  Wt 238 lb 4.8 oz (108.092 kg)  BMI 37.31 kg/m2  Constitutional:  Alert and oriented, No acute distress. HEENT: [place] AT, moist mucus membranes.  Trachea midline, no masses. Cardiovascular: No clubbing, cyanosis, or edema. Respiratory: Normal respiratory effort, no increased work of breathing. GI: Abdomen is soft, nontender, nondistended, no abdominal masses GU: No CVA tenderness.  Skin: No rashes, bruises or suspicious lesions. Lymph: No cervical or inguinal adenopathy. Neurologic: Grossly intact, no focal deficits, moving all 4 extremities. Psychiatric: Normal mood and affect.  Laboratory Data: Lab Results  Component Value Date   WBC 10.0 05/19/2015   HGB 14.1 05/19/2015   HCT 39.7* 05/19/2015   MCV 92.9 05/19/2015   PLT 186 05/19/2015    Lab Results  Component Value Date   CREATININE 1.15 05/19/2015    No results found for: PSA  No results found for: TESTOSTERONE  No results found for: HGBA1C  Urinalysis    Component Value Date/Time   COLORURINE YELLOW* 05/19/2015 0803   COLORURINE Yellow 09/07/2014 1425   APPEARANCEUR HAZY* 05/19/2015 0803   APPEARANCEUR Clear 09/07/2014 1425   LABSPEC 1.029 05/19/2015 0803   LABSPEC 1.019 09/07/2014 1425   PHURINE 5.0 05/19/2015 0803   PHURINE 5.0 09/07/2014 1425   GLUCOSEU Negative 05/22/2015 1526    GLUCOSEU Negative 09/07/2014 1425   HGBUR 3+* 05/19/2015 0803   HGBUR Negative 09/07/2014 1425   BILIRUBINUR Negative 05/22/2015 1526   BILIRUBINUR NEGATIVE 05/19/2015 0803   BILIRUBINUR Negative 09/07/2014 1425   KETONESUR TRACE* 05/19/2015 0803   KETONESUR Negative 09/07/2014 1425   PROTEINUR 30* 05/19/2015 0803   PROTEINUR Negative 09/07/2014 1425   NITRITE Negative 05/22/2015 1526   NITRITE NEGATIVE 05/19/2015 0803   NITRITE Negative 09/07/2014 1425   LEUKOCYTESUR Negative 05/22/2015 1526   LEUKOCYTESUR NEGATIVE 05/19/2015 0803   LEUKOCYTESUR Negative 09/07/2014 1425     Assessment & Plan:    1. Calculus of right ureter The patient appears to be tolerating medical expulsive therapy. We'll continue this with Flomax,  Percocet, and straining urine. He was instructed to go to the ER where the office if his pain becomes unbearable or he develops a fever over 100.3. He will follow-up in 2 weeks with a renal ultrasound prior as his stone is not visible on KUB.   Return in about 2 weeks (around 06/12/2015) for with renal ultrasound prior.  Hildred Laser, MD  [facility] [address] [place], [place] [address] [phone]

## 2015-06-10 ENCOUNTER — Telehealth: Payer: Self-pay | Admitting: Urology

## 2015-06-10 NOTE — Telephone Encounter (Signed)
Patient called and said that he has 6 more pain pills left and wants more. He has a follow up appt on 06-13-15.  Please have the nurse call him back if he can have more   Thanks  michelle

## 2015-06-11 ENCOUNTER — Ambulatory Visit
Admission: RE | Admit: 2015-06-11 | Discharge: 2015-06-11 | Disposition: A | Discharge status: Home / Self Care | Payer: Federal, State, Local not specified - PPO | Source: Ambulatory Visit | Attending: Urology | Admitting: Urology

## 2015-06-11 DIAGNOSIS — Z87442 Personal history of urinary calculi: Secondary | ICD-10-CM | POA: Diagnosis present

## 2015-06-11 DIAGNOSIS — Z09 Encounter for follow-up examination after completed treatment for conditions other than malignant neoplasm: Secondary | ICD-10-CM | POA: Insufficient documentation

## 2015-06-11 DIAGNOSIS — N201 Calculus of ureter: Secondary | ICD-10-CM

## 2015-06-13 ENCOUNTER — Encounter: Payer: Self-pay | Admitting: Urology

## 2015-06-13 ENCOUNTER — Ambulatory Visit (INDEPENDENT_AMBULATORY_CARE_PROVIDER_SITE_OTHER): Payer: Federal, State, Local not specified - PPO | Admitting: Urology

## 2015-06-13 VITALS — BP 118/76 | HR 86 | Ht 67.0 in | Wt 236.5 lb

## 2015-06-13 DIAGNOSIS — N200 Calculus of kidney: Secondary | ICD-10-CM | POA: Diagnosis not present

## 2015-06-13 LAB — URINALYSIS, COMPLETE
BILIRUBIN UA: NEGATIVE
Ketones, UA: NEGATIVE
Leukocytes, UA: NEGATIVE
Nitrite, UA: NEGATIVE
PROTEIN UA: NEGATIVE
Specific Gravity, UA: 1.025 (ref 1.005–1.030)
UUROB: 0.2 mg/dL (ref 0.2–1.0)
pH, UA: 5.5 (ref 5.0–7.5)

## 2015-06-13 LAB — MICROSCOPIC EXAMINATION
BACTERIA UA: NONE SEEN
EPITHELIAL CELLS (NON RENAL): NONE SEEN /HPF (ref 0–10)
RBC, UA: >30 /hpf — ABNORMAL HIGH (ref 0–?)

## 2015-06-13 NOTE — Progress Notes (Signed)
06/13/2015 1:57 PM   Vara Guardian [birth year] [id number]  Referring provider: Lynnea Ferrier, MD [address] [facility] [place], [place] [address]  No chief complaint on file.   HPI: Gerald Myers is a 44yo seen in consultation for right ureteral calculi. 1 month ago he had dull intermittent right back pain. 3 days ago he awoke to severe, intermittent, sharp, nonradiating right flank pain. He went to the ER and was diagnosed with a 5mm R mid ureteral calculi. He is on percocet, flomax. His pain is a 2-3/10. He denies any LUTS. The stone was not visible on KUB.  Interval history: The patient returns today with a renal ultrasound showing resolution of hydronephrosis dressings stone has passed. He did not KUB is a stone is not visible on previous KUB.  However, the patient is still having symptoms and pain. The pain has moved more from his flank and side to his right lower quadrant and radiating to his right testicle. He has not passed the stone. He still requiring pain medicine has not taken any in the last 24 hours. He is on Flomax. He denies any fevers or chills.  PMH: Past Medical History  Diagnosis Date  . Heart disease   . Hematuria   . Diabetes (HCC)   . Urinary frequency   . History of kidney stones   . Diabetes mellitus without complication (HCC) 12/16/2014  . Glenoid labral tear 08/05/2014  . Microscopic hematuria 05/22/2015    Overview:  prior history of renal stones. Renal Ultrasound 11/13 unremarkable   . Apnea, sleep 05/22/2015    Overview:  on CPAP   . Elevated blood pressure reading without diagnosis of hypertension 05/22/2015  . Flank pain 05/22/2015    Surgical History: Past Surgical History  Procedure Laterality Date  . Tonsillectomy    . Tubes in ear    . Shoulder surgery Left   . Nasal sinus surgery      Home Medications:    Medication List       This list is accurate as of: 06/13/15  1:57 PM.  Always use your most recent  med list.               ALPRAZolam 0.5 MG tablet  Commonly known as:  XANAX  Take 1 tablet by mouth 2 (two) times daily as needed. Reported on 05/22/2015     buPROPion 150 MG 12 hr tablet  Commonly known as:  WELLBUTRIN SR  Take 1 tablet by mouth 2 (two) times daily.     fenofibrate 145 MG tablet  Commonly known as:  TRICOR  Take 1 tablet by mouth daily.     fluticasone 50 MCG/ACT nasal spray  Commonly known as:  FLONASE  Place 2 sprays into both nostrils daily.     ibuprofen 200 MG tablet  Commonly known as:  ADVIL,MOTRIN  Take 200 mg by mouth every 6 (six) hours as needed.     metFORMIN 1000 MG tablet  Commonly known as:  GLUCOPHAGE  Take 1,000 mg by mouth 2 (two) times daily with a meal.     ondansetron 4 MG tablet  Commonly known as:  ZOFRAN  Take 1 tablet (4 mg total) by mouth daily as needed for nausea or vomiting.     oxyCODONE-acetaminophen 5-325 MG tablet  Commonly known as:  PERCOCET  Take 2 tablets by mouth every 6 (six) hours as needed for moderate pain or severe pain.  tamsulosin 0.4 MG Caps capsule  Commonly known as:  FLOMAX  Take 1 capsule (0.4 mg total) by mouth daily after breakfast.        Allergies: No Known Allergies  Family History: Family History  Problem Relation Age of Onset  . Bladder Cancer Neg Hx   . Prostate cancer Neg Hx   . Kidney cancer Neg Hx   . Nephrolithiasis Father     Social History:  reports that he has been smoking Cigarettes.  He has been smoking about 0.50 packs per day. He does not have any smokeless tobacco history on file. He reports that he drinks alcohol. He reports that he does not use illicit drugs.  ROS:                                        Physical Exam: There were no vitals taken for this visit.  Constitutional:  Alert and oriented, No acute distress. HEENT: [place] AT, moist mucus membranes.  Trachea midline, no masses. Cardiovascular: No clubbing, cyanosis, or  edema. Respiratory: Normal respiratory effort, no increased work of breathing. GI: Abdomen is soft, nontender, nondistended, no abdominal masses GU: No CVA tenderness.  Skin: No rashes, bruises or suspicious lesions. Lymph: No cervical or inguinal adenopathy. Neurologic: Grossly intact, no focal deficits, moving all 4 extremities. Psychiatric: Normal mood and affect.  Laboratory Data: Lab Results  Component Value Date   WBC 10.0 05/19/2015   HGB 14.1 05/19/2015   HCT 39.7* 05/19/2015   MCV 92.9 05/19/2015   PLT 186 05/19/2015    Lab Results  Component Value Date   CREATININE 1.15 05/19/2015    No results found for: PSA  No results found for: TESTOSTERONE  No results found for: HGBA1C  Urinalysis    Component Value Date/Time   COLORURINE YELLOW* 05/19/2015 0803   COLORURINE Yellow 09/07/2014 1425   APPEARANCEUR HAZY* 05/19/2015 0803   APPEARANCEUR Clear 09/07/2014 1425   LABSPEC 1.029 05/19/2015 0803   LABSPEC 1.019 09/07/2014 1425   PHURINE 5.0 05/19/2015 0803   PHURINE 5.0 09/07/2014 1425   GLUCOSEU 1+* 05/29/2015 1043   GLUCOSEU Negative 09/07/2014 1425   HGBUR 3+* 05/19/2015 0803   HGBUR Negative 09/07/2014 1425   BILIRUBINUR Negative 05/29/2015 1043   BILIRUBINUR NEGATIVE 05/19/2015 0803   BILIRUBINUR Negative 09/07/2014 1425   KETONESUR TRACE* 05/19/2015 0803   KETONESUR Negative 09/07/2014 1425   PROTEINUR 30* 05/19/2015 0803   PROTEINUR Negative 09/07/2014 1425   NITRITE Negative 05/29/2015 1043   NITRITE NEGATIVE 05/19/2015 0803   NITRITE Negative 09/07/2014 1425   LEUKOCYTESUR Negative 05/29/2015 1043   LEUKOCYTESUR NEGATIVE 05/19/2015 0803   LEUKOCYTESUR Negative 09/07/2014 1425    Pertinent Imaging: EXAM: RENAL / URINARY TRACT ULTRASOUND COMPLETE  COMPARISON: CT Abdomen and Pelvis 05/19/2015 and earlier  FINDINGS: Right Kidney:  Length: 13.9 cm. Resolved mild hydronephrosis. Echogenicity within normal limits. No mass or  hydronephrosis visualized.  Left Kidney:  Length: 13.1 cm. Echogenicity within normal limits. No mass or hydronephrosis visualized.  Bladder:  Appears normal for degree of bladder distention. No hydroureter is evident.  IMPRESSION: Negative. Resolved mild right hydronephrosis seen on 05/19/2015 suggesting resolved right ureteral calculus/obstructive uropathy.  Assessment & Plan:    The patient is still symptomatic from his right ureteral stone and appears to be moving more distally possibly now in the intramural segment of the ureter based off his symptoms. I  do not think further imaging via KUB are ultrasound be helpful due to the fact that it is not visible on KUB and the ultrasound showed no hydronephrosis today despite symptoms. He wishes to continue medical expulsive therapy at this time. He will follow-up in 2 weeks. At that time if he has not passed the stone, we will need to consider either repeat CT scan or scheduling him for definitive surgical management of his stone. He is not interested in proceeding with CT or surgery at this time., But he is aware that it may be his only option at his next visit. He will continue to strain his urine.   1. Calculus of right ureter -Continue medical expulsive therapy with Flomax, Percocet, and straining of urine -Follow up in 2 weeks  No Follow-up on file.  Hildred Laser, MD  [facility] [address], [address] [place], [place] [address] [phone]

## 2015-06-20 ENCOUNTER — Other Ambulatory Visit: Payer: Self-pay | Admitting: Internal Medicine

## 2015-06-20 DIAGNOSIS — R1011 Right upper quadrant pain: Secondary | ICD-10-CM

## 2015-06-27 ENCOUNTER — Encounter: Payer: Self-pay | Admitting: Urology

## 2015-06-27 ENCOUNTER — Ambulatory Visit (INDEPENDENT_AMBULATORY_CARE_PROVIDER_SITE_OTHER): Payer: Federal, State, Local not specified - PPO | Admitting: Urology

## 2015-06-27 VITALS — BP 120/73 | HR 86 | Ht 67.0 in | Wt 235.9 lb

## 2015-06-27 DIAGNOSIS — R3129 Other microscopic hematuria: Secondary | ICD-10-CM

## 2015-06-27 DIAGNOSIS — N201 Calculus of ureter: Secondary | ICD-10-CM

## 2015-06-27 LAB — URINALYSIS, COMPLETE
BILIRUBIN UA: NEGATIVE
GLUCOSE, UA: NEGATIVE
KETONES UA: NEGATIVE
LEUKOCYTES UA: NEGATIVE
Nitrite, UA: NEGATIVE
PROTEIN UA: NEGATIVE
RBC UA: NEGATIVE
SPEC GRAV UA: 1.02 (ref 1.005–1.030)
Urobilinogen, Ur: 0.2 mg/dL (ref 0.2–1.0)
pH, UA: 6 (ref 5.0–7.5)

## 2015-06-27 LAB — MICROSCOPIC EXAMINATION
BACTERIA UA: NONE SEEN
EPITHELIAL CELLS (NON RENAL): NONE SEEN /HPF (ref 0–10)

## 2015-06-27 NOTE — Progress Notes (Signed)
06/27/2015 12:14 PM   Vara Guardian 45-May-1972 [id number]  Referring provider: Lynnea Ferrier, MD [address] [facility] [place], [place] [address]  Chief Complaint  Patient presents with  . Follow-up    Renal , Pt passed kidney stone x 4 days ago    HPI: Mr Gerald Myers is a 45yo seen in consultation for right ureteral calculi. 1 month ago he had dull intermittent right back pain. 3 days ago he awoke to severe, intermittent, sharp, nonradiating right flank pain. He went to the ER and was diagnosed with a 5mm R mid ureteral calculi. He is on percocet, flomax. His pain is a 2-3/10. He denies any LUTS. The stone was not visible on KUB.  January 2017 Interval history: The patient returns today with a renal ultrasound showing resolution of hydronephrosis dressings stone has passed. He did not KUB is a stone is not visible on previous KUB.  However, the patient is still having symptoms and pain. The pain has moved more from his flank and side to his right lower quadrant and radiating to his right testicle. He has not passed the stone. He still requiring pain medicine has not taken any in the last 24 hours. He is on Flomax. He denies any fevers or chills.   February 2017 Interval History: The patient returns today after passing his right ureteral calculus. He is brought in for stone analysis. His renal ultrasound shows resolution of hydronephrosis. Microscopic hematuria is also resolved. The patient is doing well without complaints.  PMH: Past Medical History  Diagnosis Date  . Heart disease   . Hematuria   . Diabetes (HCC)   . Urinary frequency   . History of kidney stones   . Diabetes mellitus without complication (HCC) 12/16/2014  . Glenoid labral tear 08/05/2014  . Microscopic hematuria 05/22/2015    Overview:  prior history of renal stones. Renal Ultrasound 11/13 unremarkable   . Apnea, sleep 05/22/2015    Overview:  on CPAP   . Elevated blood pressure  reading without diagnosis of hypertension 05/22/2015  . Flank pain 05/22/2015    Surgical History: Past Surgical History  Procedure Laterality Date  . Tonsillectomy    . Tubes in ear    . Shoulder surgery Left   . Nasal sinus surgery      Home Medications:    Medication List       This list is accurate as of: 06/27/15 12:14 PM.  Always use your most recent med list.               ALPRAZolam 0.5 MG tablet  Commonly known as:  XANAX  Take 1 tablet by mouth 2 (two) times daily as needed. Reported on 05/22/2015     buPROPion 150 MG 12 hr tablet  Commonly known as:  WELLBUTRIN SR  Take 1 tablet by mouth 2 (two) times daily.     fenofibrate 145 MG tablet  Commonly known as:  TRICOR  Take 1 tablet by mouth daily.     fluticasone 50 MCG/ACT nasal spray  Commonly known as:  FLONASE  Place 2 sprays into both nostrils daily.     ibuprofen 200 MG tablet  Commonly known as:  ADVIL,MOTRIN  Take 200 mg by mouth every 6 (six) hours as needed. Reported on 06/13/2015     metFORMIN 1000 MG tablet  Commonly known as:  GLUCOPHAGE  Take 1,000 mg by mouth 2 (two) times daily with a meal.  omega-3 acid ethyl esters 1 g capsule  Commonly known as:  LOVAZA  Take by mouth.     ondansetron 4 MG tablet  Commonly known as:  ZOFRAN  Take 1 tablet (4 mg total) by mouth daily as needed for nausea or vomiting.     oxyCODONE-acetaminophen 5-325 MG tablet  Commonly known as:  PERCOCET  Take 2 tablets by mouth every 6 (six) hours as needed for moderate pain or severe pain.     tamsulosin 0.4 MG Caps capsule  Commonly known as:  FLOMAX  Take 1 capsule (0.4 mg total) by mouth daily after breakfast.        Allergies: No Known Allergies  Family History: Family History  Problem Relation Age of Onset  . Bladder Cancer Neg Hx   . Prostate cancer Neg Hx   . Kidney cancer Neg Hx   . Nephrolithiasis Father     Social History:  reports that he has been smoking Cigarettes.  He has been  smoking about 0.50 packs per day. He does not have any smokeless tobacco history on file. He reports that he drinks alcohol. He reports that he does not use illicit drugs.  ROS: UROLOGY Frequent Urination?: No Hard to postpone urination?: No Burning/pain with urination?: No Get up at night to urinate?: No Leakage of urine?: No Urine stream starts and stops?: No Trouble starting stream?: No Do you have to strain to urinate?: No Blood in urine?: No Urinary tract infection?: No Sexually transmitted disease?: No Injury to kidneys or bladder?: No Painful intercourse?: No Weak stream?: No Erection problems?: No Penile pain?: No  Gastrointestinal Nausea?: No Vomiting?: No Indigestion/heartburn?: No Diarrhea?: No Constipation?: No  Constitutional Fever: No Night sweats?: No Weight loss?: No Fatigue?: No  Skin Skin rash/lesions?: No Itching?: No  Eyes Blurred vision?: No Double vision?: No  Ears/Nose/Throat Sore throat?: No Sinus problems?: No  Hematologic/Lymphatic Swollen glands?: No Easy bruising?: No  Cardiovascular Leg swelling?: No Chest pain?: No  Respiratory Cough?: No Shortness of breath?: No  Endocrine Excessive thirst?: No  Musculoskeletal Back pain?: No Joint pain?: No  Neurological Headaches?: No Dizziness?: No  Psychologic Depression?: No Anxiety?: No  Physical Exam: BP 120/73 mmHg  Pulse 86  Ht  (1.702 m)  Wt 235 lb 14.4 oz (107.004 kg)  BMI 36.94 kg/m2  Constitutional:  Alert and oriented, No acute distress. HEENT: [place] AT, moist mucus membranes.  Trachea midline, no masses. Cardiovascular: No clubbing, cyanosis, or edema. Respiratory: Normal respiratory effort, no increased work of breathing. GI: Abdomen is soft, nontender, nondistended, no abdominal masses GU: No CVA tenderness.  Skin: No rashes, bruises or suspicious lesions. Lymph: No cervical or inguinal adenopathy. Neurologic: Grossly intact, no focal deficits,  moving all 4 extremities. Psychiatric: Normal mood and affect.  Laboratory Data: Lab Results  Component Value Date   WBC 10.0 05/19/2015   HGB 14.1 05/19/2015   HCT 39.7* 05/19/2015   MCV 92.9 05/19/2015   PLT 186 05/19/2015    Lab Results  Component Value Date   CREATININE 1.15 05/19/2015    No results found for: PSA  No results found for: TESTOSTERONE  No results found for: HGBA1C  Urinalysis    Component Value Date/Time   COLORURINE YELLOW* 05/19/2015 0803   COLORURINE Yellow 09/07/2014 1425   APPEARANCEUR HAZY* 05/19/2015 0803   APPEARANCEUR Clear 09/07/2014 1425   LABSPEC 1.029 05/19/2015 0803   LABSPEC 1.019 09/07/2014 1425   PHURINE 5.0 05/19/2015 0803   PHURINE 5.0  09/07/2014 1425   GLUCOSEU 2+* 06/13/2015 1403   GLUCOSEU Negative 09/07/2014 1425   HGBUR 3+* 05/19/2015 0803   HGBUR Negative 09/07/2014 1425   BILIRUBINUR Negative 06/13/2015 1403   BILIRUBINUR NEGATIVE 05/19/2015 0803   BILIRUBINUR Negative 09/07/2014 1425   KETONESUR TRACE* 05/19/2015 0803   KETONESUR Negative 09/07/2014 1425   PROTEINUR 30* 05/19/2015 0803   PROTEINUR Negative 09/07/2014 1425   NITRITE Negative 06/13/2015 1403   NITRITE NEGATIVE 05/19/2015 0803   NITRITE Negative 09/07/2014 1425   LEUKOCYTESUR Negative 06/13/2015 1403   LEUKOCYTESUR NEGATIVE 05/19/2015 0803   LEUKOCYTESUR Negative 09/07/2014 1425    Pertinent Imaging: CLINICAL DATA: 45 year old male with right ureteral calculus in December. Subsequent encounter.  EXAM: RENAL / URINARY TRACT ULTRASOUND COMPLETE  COMPARISON: CT Abdomen and Pelvis 05/19/2015 and earlier  FINDINGS: Right Kidney:  Length: 13.9 cm. Resolved mild hydronephrosis. Echogenicity within normal limits. No mass or hydronephrosis visualized.  Left Kidney:  Length: 13.1 cm. Echogenicity within normal limits. No mass or hydronephrosis visualized.  Bladder:  Appears normal for degree of bladder distention. No hydroureter  is evident.  IMPRESSION: Negative. Resolved mild right hydronephrosis seen on 05/19/2015 suggesting resolved right ureteral calculus/obstructive uropathy.  Assessment & Plan:    The patient successfully passed his stone with medical expulsive therapy. This is only a second stone in his first one was decades ago. I do not think he needs a 24-hour urinalysis at this time. We will send the patient his stone analysis report when it is available. He will follow-up when necessary or if he has an unusual stone composition.  1. Calculus of right ureter -send stone for stone analysis -We will send the patient his results when available.  -Unless he has an unusual stone type, he does not need further follow-up. -Patient will follow up when necessary  2. Microscopic hematuria -Resolved after stone passage.   Return if symptoms worsen or fail to improve.  Hildred Laser, MD  [facility] [address] [place], [place] [address] [phone]

## 2015-07-01 ENCOUNTER — Encounter
Admission: RE | Admit: 2015-07-01 | Discharge: 2015-07-01 | Disposition: A | Discharge status: Home / Self Care | Payer: Federal, State, Local not specified - PPO | Source: Ambulatory Visit | Attending: Internal Medicine | Admitting: Internal Medicine

## 2015-07-01 DIAGNOSIS — R1011 Right upper quadrant pain: Secondary | ICD-10-CM | POA: Diagnosis not present

## 2015-07-01 HISTORY — DX: Personal history of other diseases of the digestive system: Z87.19

## 2015-07-01 MED ORDER — SINCALIDE 5 MCG IJ SOLR
0.0200 ug/kg | Freq: Once | INTRAMUSCULAR | Status: AC
  Administered 2015-07-01: 2.14 ug via INTRAVENOUS

## 2015-07-01 MED ORDER — TECHNETIUM TC 99M MEBROFENIN IV KIT
5.4580 | PACK | Freq: Once | INTRAVENOUS | Status: AC | PRN
  Administered 2015-07-01: 5.458 via INTRAVENOUS

## 2015-07-09 ENCOUNTER — Encounter: Payer: Self-pay | Admitting: Obstetrics and Gynecology

## 2015-07-10 ENCOUNTER — Encounter: Payer: Self-pay | Admitting: Obstetrics and Gynecology

## 2016-09-26 ENCOUNTER — Emergency Department: Payer: Federal, State, Local not specified - PPO

## 2016-09-26 ENCOUNTER — Emergency Department
Admission: EM | Admit: 2016-09-26 | Discharge: 2016-09-26 | Disposition: A | Discharge status: Home / Self Care | Payer: Federal, State, Local not specified - PPO | Attending: Emergency Medicine | Admitting: Emergency Medicine

## 2016-09-26 DIAGNOSIS — R05 Cough: Secondary | ICD-10-CM | POA: Insufficient documentation

## 2016-09-26 DIAGNOSIS — E119 Type 2 diabetes mellitus without complications: Secondary | ICD-10-CM | POA: Insufficient documentation

## 2016-09-26 DIAGNOSIS — R079 Chest pain, unspecified: Secondary | ICD-10-CM

## 2016-09-26 DIAGNOSIS — F17210 Nicotine dependence, cigarettes, uncomplicated: Secondary | ICD-10-CM | POA: Insufficient documentation

## 2016-09-26 DIAGNOSIS — Z7984 Long term (current) use of oral hypoglycemic drugs: Secondary | ICD-10-CM | POA: Insufficient documentation

## 2016-09-26 LAB — CBC
HCT: 43.1 % (ref 40.0–52.0)
HEMOGLOBIN: 15.5 g/dL (ref 13.0–18.0)
MCH: 33.8 pg (ref 26.0–34.0)
MCHC: 36 g/dL (ref 32.0–36.0)
MCV: 93.8 fL (ref 80.0–100.0)
PLATELETS: 183 10*3/uL (ref 150–440)
RBC: 4.59 MIL/uL (ref 4.40–5.90)
RDW: 13.4 % (ref 11.5–14.5)
WBC: 10.1 10*3/uL (ref 3.8–10.6)

## 2016-09-26 LAB — BASIC METABOLIC PANEL
Anion gap: 9 (ref 5–15)
BUN: 15 mg/dL (ref 6–20)
CHLORIDE: 103 mmol/L (ref 101–111)
CO2: 25 mmol/L (ref 22–32)
CREATININE: 1.09 mg/dL (ref 0.61–1.24)
Calcium: 9.6 mg/dL (ref 8.9–10.3)
GFR calc non Af Amer: >60 mL/min (ref >60)
GLUCOSE: 192 mg/dL — AB (ref 65–99)
Potassium: 3.9 mmol/L (ref 3.5–5.1)
Sodium: 137 mmol/L (ref 135–145)

## 2016-09-26 LAB — TROPONIN I: Troponin I: <0.03 ng/mL (ref <0.03)

## 2016-09-26 MED ORDER — KETOROLAC TROMETHAMINE 60 MG/2ML IM SOLN
30.0000 mg | Freq: Once | INTRAMUSCULAR | Status: AC
  Administered 2016-09-26: 30 mg via INTRAMUSCULAR
  Filled 2016-09-26: qty 2

## 2016-09-26 NOTE — ED Provider Notes (Signed)
[facility] Emergency Department Provider Note  ____________________________________________   First MD Initiated Contact with Patient 09/26/16 2128     (approximate)  I have reviewed the triage vital signs and the nursing notes.   HISTORY  Chief Complaint Chest Pain   HPI Gerald Myers is a 46 y.o. male with a history of diabetes as well as kidney stones and gallstones was presenting emergency department with right-sided chest pain after cough. He says that he was mid cough, coughing forcefully when he experienced a sudden onset of sharp right-sided chest pain. He says that his chest pain is persistent and worse with stretching feeling when he holds up his right arm. He does not report any nausea or vomiting.   Past Medical History:  Diagnosis Date  . Apnea, sleep 05/22/2015   Overview:  on CPAP   . Diabetes (HCC)   . Diabetes mellitus without complication (HCC) 12/16/2014  . Elevated blood pressure reading without diagnosis of hypertension 05/22/2015  . Flank pain 05/22/2015  . Glenoid labral tear 08/05/2014  . Heart disease   . Hematuria   . History of kidney stones   . Microscopic hematuria 05/22/2015   Overview:  prior history of renal stones. Renal Ultrasound 11/13 unremarkable   . Personal history of gallstones "a year ago"  early 2016?  Marland Kitchen Urinary frequency     Patient Active Problem List   Diagnosis Date Noted  . Elevated blood pressure reading without diagnosis of hypertension 05/22/2015  . Clinical depression 05/22/2015  . Microscopic hematuria 05/22/2015  . HLD (hyperlipidemia) 05/22/2015  . Apnea, sleep 05/22/2015  . Calculus of right ureter 05/22/2015  . Flank pain 05/22/2015  . Calculi, ureter 05/22/2015  . Diabetes mellitus without complication (HCC) 12/16/2014  . Glenoid labral tear 08/05/2014  . Other synovitis and tenosynovitis, left shoulder 08/05/2014    Past Surgical History:  Procedure Laterality Date  .  NASAL SINUS SURGERY    . SHOULDER SURGERY Left   . TONSILLECTOMY    . tubes in ear      Prior to Admission medications   Medication Sig Start Date End Date Taking? Authorizing Provider  glipiZIDE (GLUCOTROL XL) 2.5 MG 24 hr tablet Take 2.5 mg by mouth daily.   Yes [provider]  ALPRAZolam Prudy Feeler) 0.5 MG tablet Take 1 tablet by mouth 2 (two) times daily as needed. Reported on 05/22/2015 05/13/15   [provider]  buPROPion (WELLBUTRIN SR) 150 MG 12 hr tablet Take 1 tablet by mouth 2 (two) times daily. 05/12/15   [provider]  fenofibrate (TRICOR) 145 MG tablet Take 1 tablet by mouth daily. 05/12/15   [provider]  fluticasone (FLONASE) 50 MCG/ACT nasal spray Place 2 sprays into both nostrils daily.  05/15/15   [provider]  metFORMIN (GLUCOPHAGE) 1000 MG tablet Take 1,000 mg by mouth 2 (two) times daily with a meal. 05/12/15   [provider]  omega-3 acid ethyl esters (LOVAZA) 1 g capsule Take 2 g by mouth daily.     [provider]  tamsulosin (FLOMAX) 0.4 MG CAPS capsule Take 1 capsule (0.4 mg total) by mouth daily after breakfast. Patient not taking: Reported on 06/27/2015 05/22/15   Malen Gauze, MD    Allergies Patient has no known allergies.  Family History  Problem Relation Age of Onset  . Bladder Cancer Neg Hx   . Prostate cancer Neg Hx   . Kidney cancer Neg Hx   . Nephrolithiasis  Father     Social History Social History  Substance Use Topics  . Smoking status: Current Every Day Smoker    Packs/day: 0.50    Types: Cigarettes  . Smokeless tobacco: Not on file  . Alcohol use 0.0 oz/week     Comment: occasional    Review of Systems  Constitutional: No fever/chills Eyes: No visual changes. ENT: No sore throat. Cardiovascular:as above Respiratory: Denies shortness of breath. Gastrointestinal: No abdominal pain.  No nausea, no vomiting.  No diarrhea.  No constipation. Genitourinary:  Negative for dysuria. Musculoskeletal: Negative for back pain. Skin: Negative for rash. Neurological: Negative for headaches, focal weakness or numbness.   ____________________________________________   PHYSICAL EXAM:  VITAL SIGNS: ED Triage Vitals  Enc Vitals Group     BP 09/26/16 2120 (!) 157/92     Pulse Rate 09/26/16 2120 85     Resp 09/26/16 2120 20     Temp 09/26/16 2120 98.6 F (37 C)     Temp Source 09/26/16 2120 Oral     SpO2 09/26/16 2120 97 %     Weight 09/26/16 2121 235 lb (106.6 kg)     Height 09/26/16 2121 5\' 7"  (1.702 m)     Head Circumference --      Peak Flow --      Pain Score 09/26/16 2120 7     Pain Loc --      Pain Edu? --      Excl. in GC? --     Constitutional: Alert and oriented. Well appearing and in no acute distress. Eyes: Conjunctivae are normal. PERRL. EOMI. Head: Atraumatic. Nose: No congestion/rhinnorhea. Mouth/Throat: Mucous membranes are moist.   Neck: No stridor.   Cardiovascular: Normal rate, regular rhythm. Grossly normal heart sounds.    Reproducible chest pain to the right lateral and inferior ribs. No overlying ecchymosis or crepitus.  Respiratory: Normal respiratory effort.  No retractions. Lungs CTAB. Gastrointestinal: Soft and nontender. No distention.  No CVA tenderness. Musculoskeletal: No lower extremity tenderness nor edema.  No joint effusions. Neurologic:  Normal speech and language. No gross focal neurologic deficits are appreciated. No gait instability. Skin:  Skin is warm, dry and intact. No rash noted. Psychiatric: Mood and affect are normal. Speech and behavior are normal.  ____________________________________________   LABS (all labs ordered are listed, but only abnormal results are displayed)  Labs Reviewed  BASIC METABOLIC PANEL - Abnormal; Notable for the following:       Result Value   Glucose, Bld 192 (*)    All other components within normal limits  CBC  TROPONIN I    ____________________________________________  EKG  ED ECG REPORT I, Arelia LongestSchaevitz,  David M, the attending physician, personally viewed and interpreted this ECG.   Date: 09/26/2016  EKG Time: 2116  Rate: 87  Rhythm: normal sinus rhythm  Axis: Normal  Intervals:none  ST&T Change: No ST segment elevation or depression. No abnormal T-wave inversion.  ____________________________________________  RADIOLOGY  DG Chest 2 View (Final result)  Result time 09/26/16 21:53:08  Final result by Tonia Ghenthang, Jeffrey, MD (09/26/16 21:53:08)           Narrative:   CLINICAL DATA: Acute onset of right rib soreness and cough. Felt something pop at the right side, with focal pain. Initial encounter.  EXAM: CHEST 2 VIEW  COMPARISON: CTA of the chest, and chest radiograph, performed 09/07/2014  FINDINGS: The lungs are well-aerated and clear. There is no evidence of focal opacification, pleural effusion or pneumothorax.  The heart is normal in size; the mediastinal contour is within normal limits. No acute osseous abnormalities are seen.  IMPRESSION: No acute cardiopulmonary process seen. No displaced rib fractures identified.   Electronically Signed By: Roanna Raider M.D. On: 09/26/2016 21:53            ____________________________________________   PROCEDURES  Procedure(s) performed:   Procedures  Critical Care performed:   ____________________________________________   INITIAL IMPRESSION / ASSESSMENT AND PLAN / ED COURSE  Pertinent labs & imaging results that were available during my care of the patient were reviewed by me and considered in my medical decision making (see chart for details).  ----------------------------------------- 10:38 PM on 09/26/2016 -----------------------------------------  Patient says that he feels mildly improved after Toradol. Likely muscle strain. Recommended continuing ice over the injury as well as a salve such as  Aspercreme or icy hot. The patient and his wife are concerned about gallbladder should. However, the patient had no tenderness over the right upper quadrant. Negative Murphy sign. No vomiting. I feel this is very unlikely given the exam as well as the history. We discussed return precautions as vomiting or worsening pain. They're understanding and willing to comply.      ____________________________________________   FINAL CLINICAL IMPRESSION(S) / ED DIAGNOSES  Chest pain.    NEW MEDICATIONS STARTED DURING THIS VISIT:  New Prescriptions   No medications on file     Note:  This document was prepared using Dragon voice recognition software and may include unintentional dictation errors.    Myrna Blazer, MD 09/26/16 2239

## 2016-09-26 NOTE — ED Triage Notes (Signed)
Pt reports soreness to the rt rib area all day, reports tonight coughing like his usual smoker's cough and states he felt something pop and had horrible pain at the rt mid to lower lobe, pt c/o pain with movement and breathing to the rt side, pt denies hx of rib fx or collapsed lung

## 2016-09-26 NOTE — ED Notes (Signed)
MD Tyson AliasShavitiz read ecg

## 2018-05-24 HISTORY — PX: VASECTOMY: SHX75

## 2018-09-07 ENCOUNTER — Ambulatory Visit: Payer: Federal, State, Local not specified - PPO | Admitting: Urology

## 2018-12-04 ENCOUNTER — Ambulatory Visit: Payer: Federal, State, Local not specified - PPO | Admitting: Urology

## 2018-12-04 ENCOUNTER — Other Ambulatory Visit: Payer: Self-pay

## 2018-12-04 ENCOUNTER — Encounter: Payer: Self-pay | Admitting: Urology

## 2018-12-04 VITALS — BP 124/70 | HR 68 | Ht 67.0 in | Wt 218.6 lb

## 2018-12-04 DIAGNOSIS — Z3009 Encounter for other general counseling and advice on contraception: Secondary | ICD-10-CM | POA: Diagnosis not present

## 2018-12-04 DIAGNOSIS — R030 Elevated blood-pressure reading, without diagnosis of hypertension: Secondary | ICD-10-CM | POA: Insufficient documentation

## 2018-12-04 NOTE — Progress Notes (Signed)
12/04/18 8:56 AM   Stoney Bang 05-Oct-1970 [id number]  Referring provider: Adin Hector, MD [facility] [place],  [place] [address]  CC: Desire for vasectomy  HPI: I saw Mr. Gerald Myers in urology clinic in consultation for vasectomy from Dr. Caryl Comes.  He is a 48 year old male with a number of co-morbidities including diabetes, sleep apnea, depression/anxiety and heart disease who desires surgical sterilization with vasectomy.  He has 3 children ranging in age from 89 months old to 42.  He and his partner would like a permanent form of sterilization.  He is not on any blood thinners.  There are no aggravating or alleviating factors.   PMH: Past Medical History:  Diagnosis Date  . Apnea, sleep 05/22/2015   Overview:  on CPAP   . Diabetes ([place])   . Diabetes mellitus without complication ([place]) 11/11/3084  . Elevated blood pressure reading without diagnosis of hypertension 05/22/2015  . Flank pain 05/22/2015  . Glenoid labral tear 08/05/2014  . Heart disease   . Hematuria   . History of kidney stones   . Microscopic hematuria 05/22/2015   Overview:  prior history of renal stones. Renal Ultrasound 11/13 unremarkable   . Personal history of gallstones "a year ago"  early 2016?  Marland Kitchen Urinary frequency     Surgical History: Past Surgical History:  Procedure Laterality Date  . NASAL SINUS SURGERY    . SHOULDER SURGERY Left   . TONSILLECTOMY    . tubes in ear      Allergies: No Known Allergies  Family History: Family History  Problem Relation Age of Onset  . Bladder Cancer Neg Hx   . Prostate cancer Neg Hx   . Kidney cancer Neg Hx   . Nephrolithiasis Father     Social History:  reports that he has been smoking cigarettes. He has been smoking about 0.50 packs per day. He does not have any smokeless tobacco history on file. He reports current alcohol use. He reports that he does not use drugs.  ROS: Please see flowsheet from today's date  for complete review of systems.  Physical Exam: BP 124/70 (BP Location: Left Arm, Patient Position: Sitting, Cuff Size: Normal)   Pulse 68   Ht 5\' 7"  (1.702 m)   Wt 218 lb 9 oz (99.1 kg)   BMI 34.23 kg/m    Constitutional:  Alert and oriented, No acute distress. Cardiovascular: No clubbing, cyanosis, or edema. Respiratory: Normal respiratory effort, no increased work of breathing. GI: Abdomen is soft, nontender, nondistended, no abdominal masses GU: No CVA tenderness, phallus without lesions, widely patent meatus.  Thickened scrotum, but vas deferens palpable bilaterally. Lymph: No cervical or inguinal lymphadenopathy. Skin: No rashes, bruises or suspicious lesions. Neurologic: Grossly intact, no focal deficits, moving all 4 extremities. Psychiatric: Normal mood and affect.  Laboratory Data: None to review  Pertinent Imaging: None to review  Assessment & Plan:   In summary, the patient is a 48 year old male who desires surgical sterilization with vasectomy.  We discussed the risks and benefits of vasectomy at length.  Vasectomy is intended to be a permanent form of contraception, and does not produce immediate sterility.  Following vasectomy another form of contraception is required until vas occlusion is confirmed by a post-vasectomy semen analysis obtained 2-3 months after the procedure.  Even after vas occlusion is confirmed, vasectomy is not 100% reliable in preventing pregnancy, and the failure rate is approximately 05/1998.  Repeat vasectomy is required in  less than 1% of patients.  He should refrain from ejaculation for 1 week after vasectomy.  Options for fertility after vasectomy include vasectomy reversal, and sperm retrieval with in vitro fertilization or ICSI.  These options are not always successful and may be expensive.  Finally, there are other permanent and non-permanent alternatives to vasectomy available. There is no risk of erectile dysfunction, and the volume of semen  will be similar to prior, as the majority of the ejaculate is from the prostate and seminal vesicles.   The procedure takes ~20 minutes.  We recommend patients take 5-10 mg of Valium 30 minutes prior, and he will need a driver post-procedure.  Local anesthetic is injected into the scrotal skin and a small segment of the vas deferens is removed, and the ends occluded. The complication rate is approximately 1-2%, and includes bleeding, infection, and development of chronic scrotal pain.  PLAN: Pending insurance approval, will schedule for vasectomy at his convenience   Sondra ComeBrian C Jizel Cheeks, MD  [facility] [address], [address] [place], [place] [address] [phone]

## 2019-01-18 ENCOUNTER — Ambulatory Visit (INDEPENDENT_AMBULATORY_CARE_PROVIDER_SITE_OTHER): Payer: Federal, State, Local not specified - PPO | Admitting: Urology

## 2019-01-18 ENCOUNTER — Encounter: Payer: Self-pay | Admitting: Urology

## 2019-01-18 ENCOUNTER — Other Ambulatory Visit: Payer: Self-pay

## 2019-01-18 VITALS — BP 130/68 | HR 81 | Ht 66.0 in | Wt 220.0 lb

## 2019-01-18 DIAGNOSIS — Z3009 Encounter for other general counseling and advice on contraception: Secondary | ICD-10-CM

## 2019-01-18 NOTE — Addendum Note (Signed)
Addended by: Tommy Rainwater on: 01/18/2019 04:27 PM   Modules accepted: Orders

## 2019-01-18 NOTE — Patient Instructions (Signed)

## 2019-01-18 NOTE — Progress Notes (Signed)
VASECTOMY PROCEDURE NOTE:  The patient was taken to the minor procedure room and placed in the supine position. His genitals were prepped and draped in the usual sterile fashion. The right vas deferens was brought up to the skin of the right upper scrotum. The skin overlying it was anesthetized with 1% lidocaine without epinephrine, anesthetic was also injected alongside the vas deferens in the direction of the inguinal canal. The no scalpel vasectomy instrument was used to make a small perforation in the scrotal skin. The vasectomy clamp was used to grasp the vas deferens. It was carefully dissected free from surrounding structures. A 1cm segment of the vas was removed, and the cut ends of the mucosa were cauterized. A figure of eight suture was used to perform fascial interposition. No significant bleeding was noted. The vas deferens was returned to the scrotum. The skin incision was closed with a simple interrupted stitch of 4-0 chromic.  Attention was then turned to the left side. The left vasectomy was performed in the same exact fashion. Sterile dressings were placed over each incision. The patient tolerated the procedure well.  IMPRESSION/DIAGNOSIS: The patient is a 48 year old gentleman who underwent a vasectomy today. Post-procedure instructions were reviewed. I stressed the importance of continuing to use birth control until he provides a semen specimen more than 2 months from now that demonstrates azoospermia.  We discussed return precautions including fever over 101, significant bleeding or hematoma, or uncontrolled pain. I also stressed the importance of avoiding strenuous activity for one week, no sexual activity or ejaculations for 5 days, intermittent icing over the next 48 hours, and scrotal support.   PLAN: The patient will be advised of his semen analysis results when available.  Nickolas Madrid, MD 01/18/2019

## 2019-04-26 ENCOUNTER — Other Ambulatory Visit: Payer: Federal, State, Local not specified - PPO

## 2019-04-26 ENCOUNTER — Other Ambulatory Visit: Payer: Self-pay

## 2019-04-26 DIAGNOSIS — Z3009 Encounter for other general counseling and advice on contraception: Secondary | ICD-10-CM

## 2019-04-27 ENCOUNTER — Telehealth: Payer: Self-pay

## 2019-04-27 LAB — POST-VAS SPERM EVALUATION,QUAL: Volume: 1.7 mL

## 2019-04-27 NOTE — Telephone Encounter (Signed)
Patient notified

## 2019-04-27 NOTE — Telephone Encounter (Signed)
-----   Message from Billey Co, MD sent at 04/27/2019  1:49 PM EST ----- No sperm seen in semen analysis, ok to stop alternative contraception  Nickolas Madrid, MD 04/27/2019

## 2020-04-21 ENCOUNTER — Other Ambulatory Visit (HOSPITAL_COMMUNITY): Payer: Self-pay | Admitting: Internal Medicine

## 2020-04-21 ENCOUNTER — Other Ambulatory Visit: Payer: Self-pay | Admitting: Internal Medicine

## 2020-04-21 DIAGNOSIS — N5089 Other specified disorders of the male genital organs: Secondary | ICD-10-CM

## 2020-04-25 ENCOUNTER — Other Ambulatory Visit: Payer: Self-pay

## 2020-04-25 ENCOUNTER — Ambulatory Visit
Admission: RE | Admit: 2020-04-25 | Discharge: 2020-04-25 | Disposition: A | Discharge status: Home / Self Care | Payer: Federal, State, Local not specified - PPO | Source: Ambulatory Visit | Attending: Internal Medicine | Admitting: Internal Medicine

## 2020-04-25 DIAGNOSIS — N5089 Other specified disorders of the male genital organs: Secondary | ICD-10-CM | POA: Diagnosis not present

## 2021-01-30 IMAGING — US US SCROTUM W/ DOPPLER COMPLETE
1 series · 14 of 25 positions shown · non-contrast
Comparison: None

CLINICAL DATA: Scrotal mass

EXAM:
SCROTAL ULTRASOUND
DOPPLER ULTRASOUND OF THE TESTICLES
TECHNIQUE: Complete ultrasound examination of the testicles, epididymis, and
other scrotal structures was performed. Color and spectral Doppler
ultrasound were also utilized to evaluate blood flow to the
testicles.

[Series 1: us scrotum w/ doppler complete · 0.07mm/px · 14 of 48 slices shown]
[im 1/48]
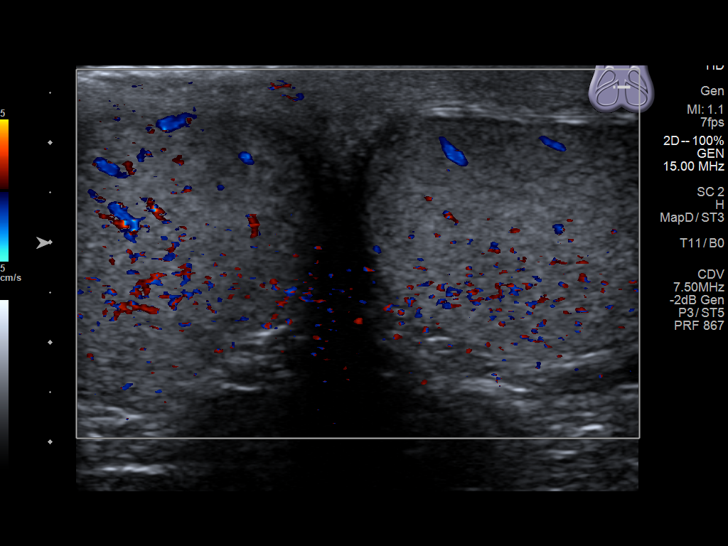
[im 4/48]
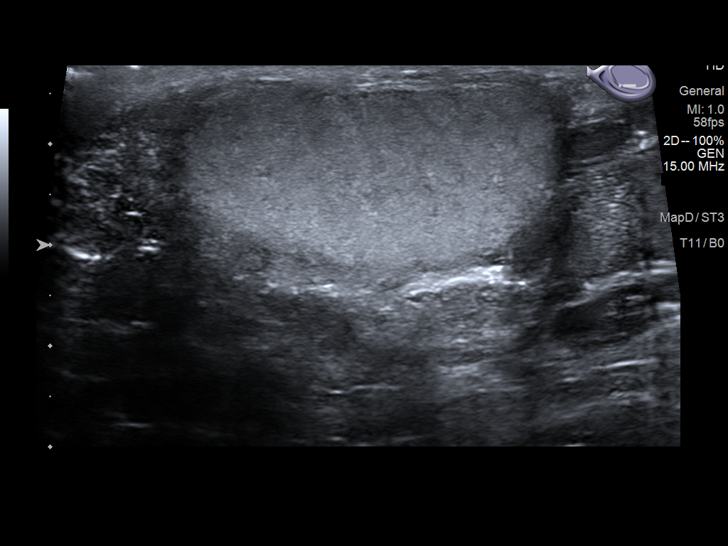
[im 8/48]
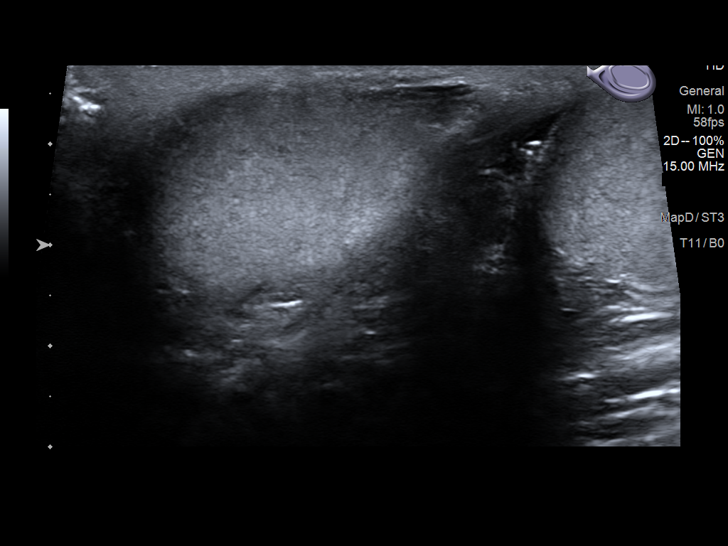
[im 12/48]
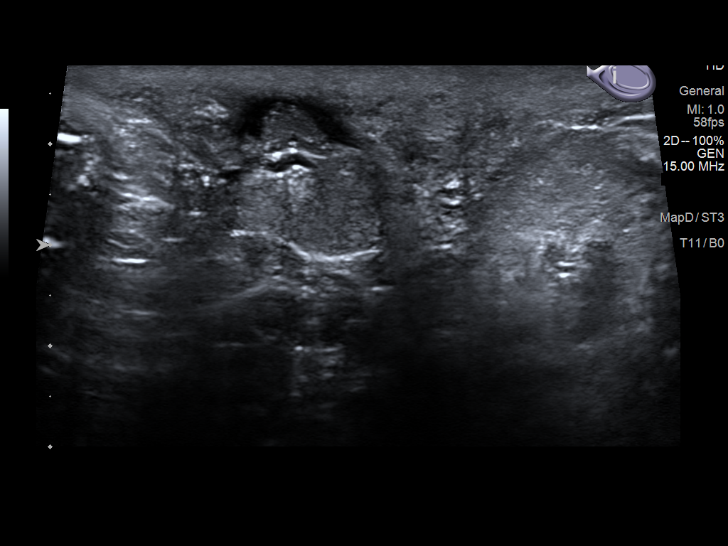
[im 16/48]
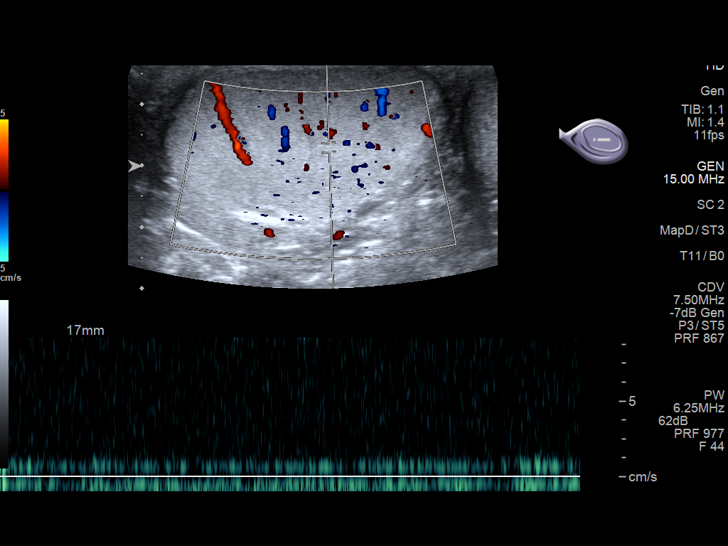
[im 18/48]
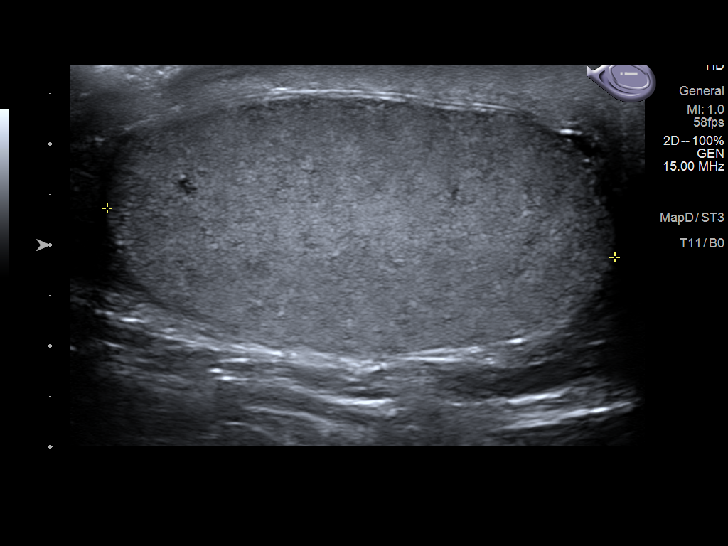
[im 22/48]
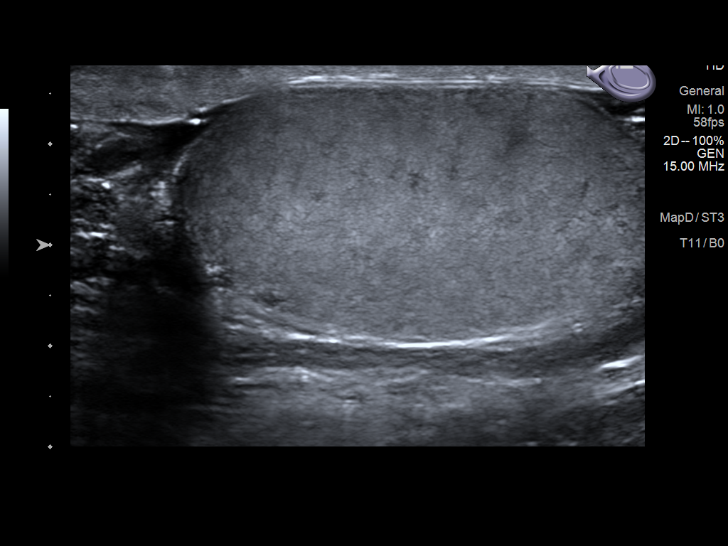
[im 26/48]
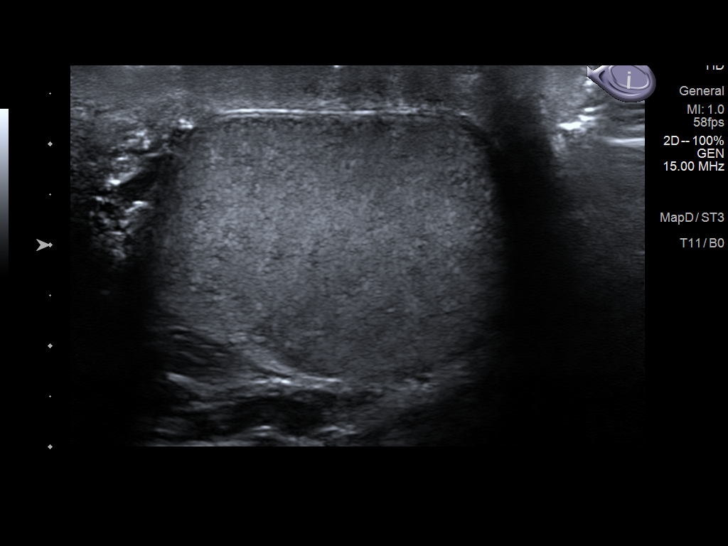
[im 30/48]
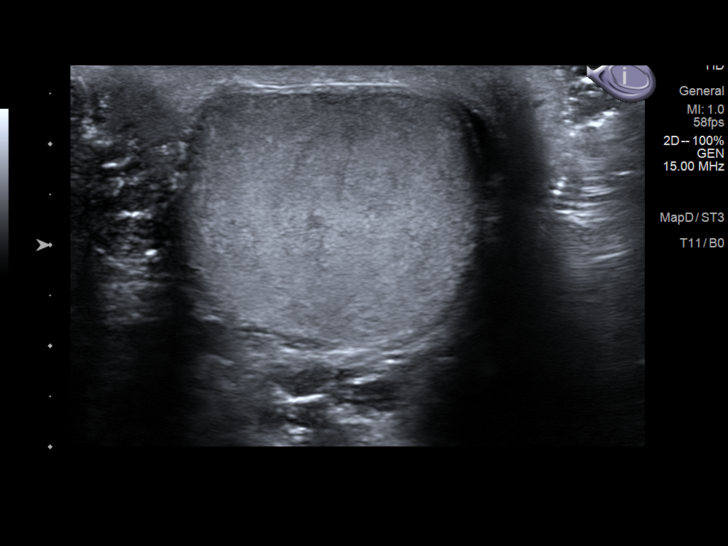
[im 32/48]
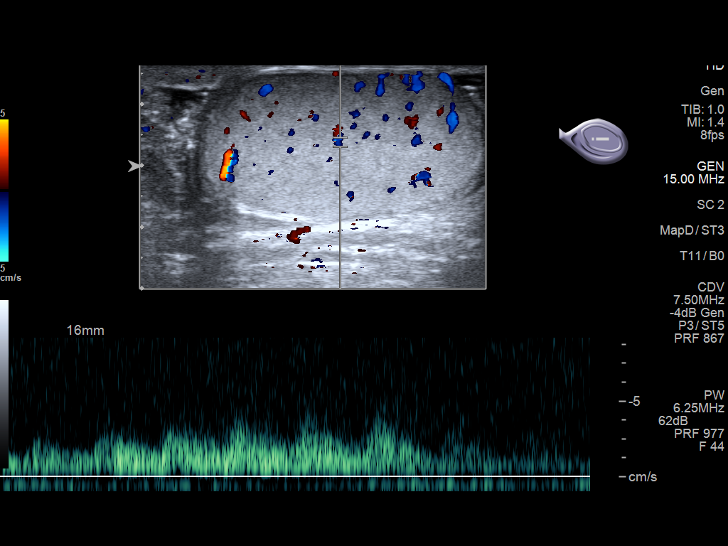
[im 36/48]
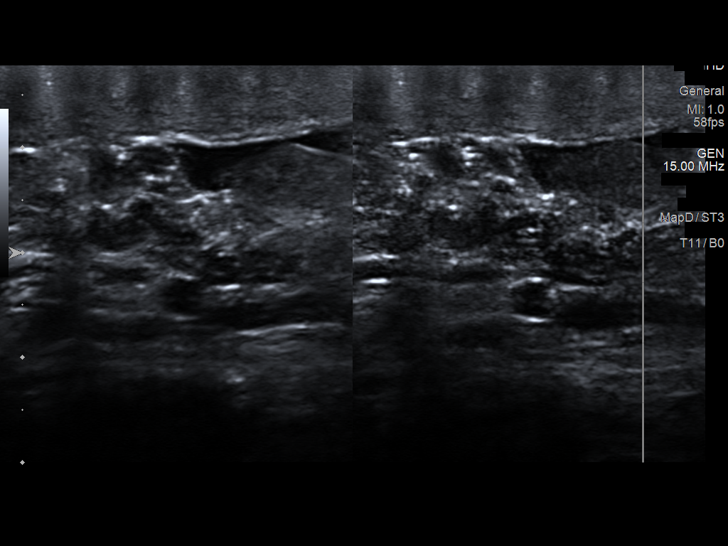
[im 40/48]
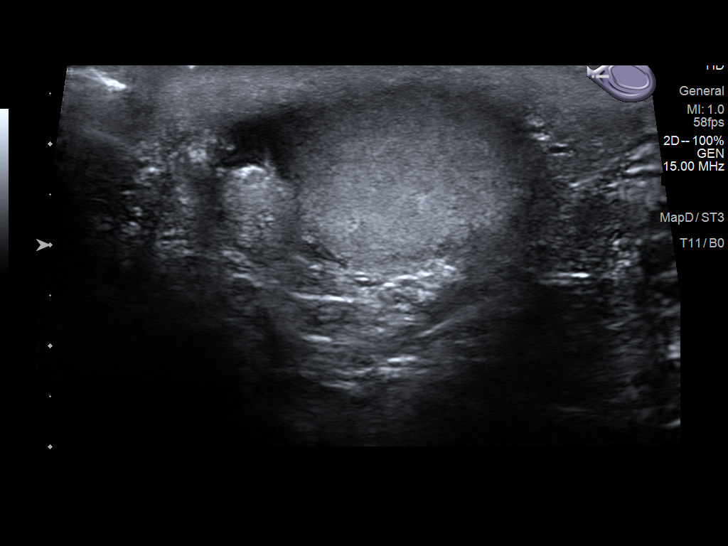
[im 44/48]
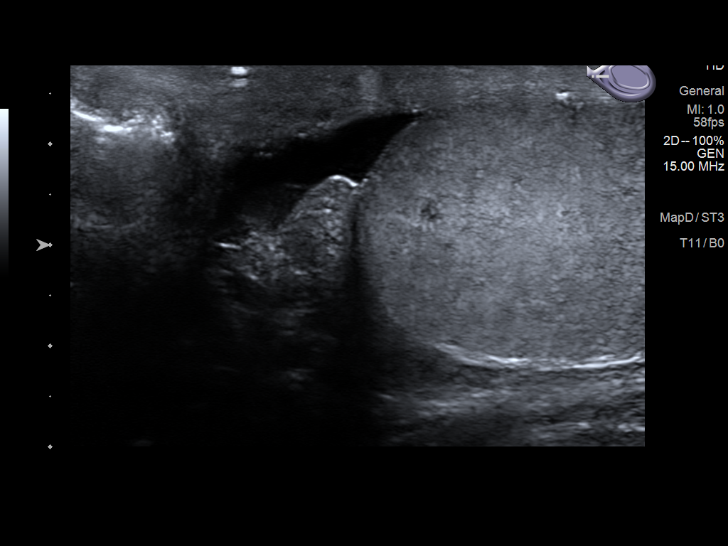
[im 48/48]
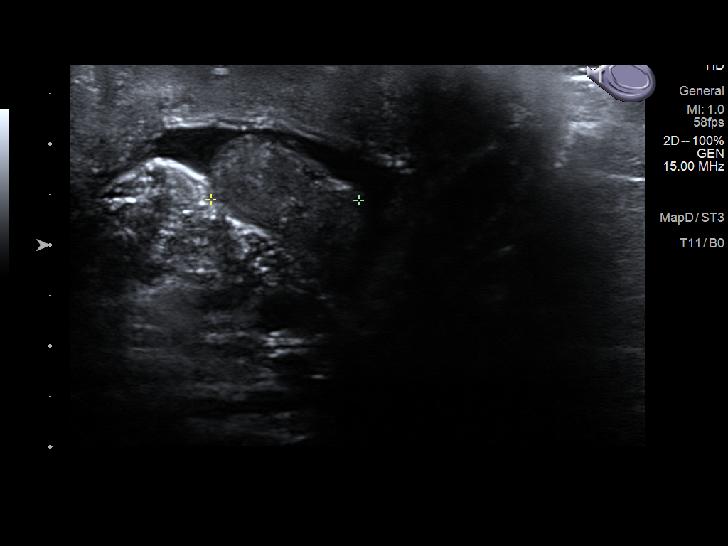

[14 of 25 positions shown; findings below may reference images not displayed]

FINDINGS: Right testicle

Measurements: 4.8 x 2.4 x 3.3 cm. Normal echogenicity without mass
or calcification. Internal blood flow present on color Doppler
imaging.

Left testicle

Measurements: 5.0 x 2.7 x 3.5 cm. Normal echogenicity without mass
or calcification. Internal blood flow present on color Doppler
imaging.

Right epididymis:  Normal in size and appearance.

Left epididymis:  Normal in size and appearance.

Hydrocele:  Small LEFT hydrocele

Varicocele:  Small LEFT varicocele present.

Pulsed Doppler interrogation of both testes demonstrates normal low
resistance arterial and venous waveforms bilaterally.
IMPRESSION: Normal appearing testes and epididymi.

Small LEFT varicocele and hydrocele.

Otherwise unremarkable exam.

## 2021-03-26 ENCOUNTER — Encounter: Payer: Self-pay | Admitting: Gastroenterology

## 2021-03-27 ENCOUNTER — Ambulatory Visit: Payer: Federal, State, Local not specified - PPO | Admitting: Certified Registered Nurse Anesthetist

## 2021-03-27 ENCOUNTER — Other Ambulatory Visit: Payer: Self-pay

## 2021-03-27 ENCOUNTER — Encounter
Admission: RE | Disposition: A | Discharge status: Home / Self Care | Payer: Self-pay | Source: Home / Self Care | Attending: Gastroenterology

## 2021-03-27 ENCOUNTER — Ambulatory Visit
Admission: RE | Admit: 2021-03-27 | Discharge: 2021-03-27 | Disposition: A | Discharge status: Home / Self Care | Payer: Federal, State, Local not specified - PPO | Attending: Gastroenterology | Admitting: Gastroenterology

## 2021-03-27 ENCOUNTER — Encounter: Payer: Self-pay | Admitting: Gastroenterology

## 2021-03-27 DIAGNOSIS — Z6836 Body mass index (BMI) 36.0-36.9, adult: Secondary | ICD-10-CM | POA: Diagnosis not present

## 2021-03-27 DIAGNOSIS — Z1211 Encounter for screening for malignant neoplasm of colon: Secondary | ICD-10-CM | POA: Diagnosis not present

## 2021-03-27 DIAGNOSIS — F32A Depression, unspecified: Secondary | ICD-10-CM | POA: Diagnosis not present

## 2021-03-27 DIAGNOSIS — K640 First degree hemorrhoids: Secondary | ICD-10-CM | POA: Diagnosis not present

## 2021-03-27 DIAGNOSIS — E669 Obesity, unspecified: Secondary | ICD-10-CM | POA: Insufficient documentation

## 2021-03-27 DIAGNOSIS — E119 Type 2 diabetes mellitus without complications: Secondary | ICD-10-CM | POA: Diagnosis not present

## 2021-03-27 DIAGNOSIS — K5730 Diverticulosis of large intestine without perforation or abscess without bleeding: Secondary | ICD-10-CM | POA: Diagnosis not present

## 2021-03-27 HISTORY — PX: COLONOSCOPY WITH PROPOFOL: SHX5780

## 2021-03-27 LAB — GLUCOSE, CAPILLARY: Glucose-Capillary: 159 mg/dL — ABNORMAL HIGH (ref 70–99)

## 2021-03-27 SURGERY — COLONOSCOPY WITH PROPOFOL
Anesthesia: General

## 2021-03-27 MED ORDER — LIDOCAINE HCL (CARDIAC) PF 100 MG/5ML IV SOSY
PREFILLED_SYRINGE | INTRAVENOUS | Status: DC | PRN
  Administered 2021-03-27: 100 mg via INTRAVENOUS

## 2021-03-27 MED ORDER — PROPOFOL 10 MG/ML IV BOLUS
INTRAVENOUS | Status: DC | PRN
  Administered 2021-03-27: 70 mg via INTRAVENOUS
  Administered 2021-03-27: 20 mg via INTRAVENOUS

## 2021-03-27 MED ORDER — PROPOFOL 500 MG/50ML IV EMUL
INTRAVENOUS | Status: DC | PRN
  Administered 2021-03-27: 140 ug/kg/min via INTRAVENOUS

## 2021-03-27 MED ORDER — EPHEDRINE 5 MG/ML INJ
INTRAVENOUS | Status: AC
  Filled 2021-03-27: qty 5

## 2021-03-27 MED ORDER — GLYCOPYRROLATE 0.2 MG/ML IJ SOLN
INTRAMUSCULAR | Status: AC
  Filled 2021-03-27: qty 1

## 2021-03-27 MED ORDER — SODIUM CHLORIDE 0.9 % IV SOLN: INTRAVENOUS | Status: DC

## 2021-03-27 NOTE — Anesthesia Preprocedure Evaluation (Signed)
Anesthesia Evaluation  Patient identified by MRN, date of birth, ID band Patient awake    Reviewed: Allergy & Precautions, H&P , NPO status , Patient's Chart, lab work & pertinent test results  History of Anesthesia Complications Negative for: history of anesthetic complications  Airway Mallampati: III  TM Distance: >3 FB     Dental   Pulmonary sleep apnea , neg COPD, former smoker,    Pulmonary exam normal        Cardiovascular hypertension, (-) angina+ CAD  (-) Past MI and (-) Cardiac Stents Normal cardiovascular exam(-) dysrhythmias      Neuro/Psych negative neurological ROS  negative psych ROS   GI/Hepatic negative GI ROS, Neg liver ROS,   Endo/Other  diabetesobesity  Renal/GU negative Renal ROS  negative genitourinary   Musculoskeletal   Abdominal   Peds  Hematology negative hematology ROS (+)   Anesthesia Other Findings Past Medical History: 05/22/2015: Apnea, sleep     Comment:  Overview:  on CPAP  No date: Diabetes (HCC) 12/16/2014: Diabetes mellitus without complication (HCC) 05/22/2015: Elevated blood pressure reading without diagnosis of  hypertension 05/22/2015: Flank pain 08/05/2014: Glenoid labral tear No date: Heart disease No date: Hematuria No date: History of kidney stones 05/22/2015: Microscopic hematuria     Comment:  Overview:  prior history of renal stones. Renal               Ultrasound 11/13 unremarkable  "a year ago"  early 2016?: Personal history of gallstones No date: Urinary frequency  Past Surgical History: No date: NASAL SINUS SURGERY No date: SHOULDER SURGERY; Left No date: TONSILLECTOMY No date: tubes in ear 2020: VASECTOMY  BMI    Body Mass Index: 36.49 kg/m      Reproductive/Obstetrics negative OB ROS                             Anesthesia Physical Anesthesia Plan  ASA: 3  Anesthesia Plan: General   Post-op Pain Management:     Induction: Intravenous  PONV Risk Score and Plan: Propofol infusion and TIVA  Airway Management Planned: Simple Face Mask  Additional Equipment:   Intra-op Plan:   Post-operative Plan:   Informed Consent: I have reviewed the patients History and Physical, chart, labs and discussed the procedure including the risks, benefits and alternatives for the proposed anesthesia with the patient or authorized representative who has indicated his/her understanding and acceptance.     Dental Advisory Given  Plan Discussed with: Anesthesiologist, CRNA and Surgeon  Anesthesia Plan Comments:         Anesthesia Quick Evaluation

## 2021-03-27 NOTE — Interval H&P Note (Signed)
History and Physical Interval Note:  03/27/2021 1:15 PM  Vara Guardian  has presented today for surgery, with the diagnosis of colon cancer screening.  The various methods of treatment have been discussed with the patient and family. After consideration of risks, benefits and other options for treatment, the patient has consented to  Procedure(s): COLONOSCOPY WITH PROPOFOL (N/A) as a surgical intervention.  The patient's history has been reviewed, patient examined, no change in status, stable for surgery.  I have reviewed the patient's chart and labs.  Questions were answered to the patient's satisfaction.     Regis Bill  Ok to proceed with colonoscopy

## 2021-03-27 NOTE — H&P (Signed)
Outpatient short stay form Pre-procedure 03/27/2021  Lesly Rubenstein, MD  Primary Physician: Adin Hector, MD  Reason for visit:  Screening Colon  History of present illness:   50 y/o gentleman with history of DM II, obesity, and depression here for screening colonoscopy. No family history of GI malignancies. No blood thinners. No significant abdominal surgeries.    Current Facility-Administered Medications:    0.9 %  sodium chloride infusion, , Intravenous, Continuous, Annamaria Helling, DO, Last Rate: 20 mL/hr at 03/27/21 1048, Restarted at 03/27/21 1247  Facility-Administered Medications Ordered in Other Encounters:    lidocaine (cardiac) 100 mg/57mL (XYLOCAINE) injection 2%, , Intravenous, Anesthesia Intra-op, Farlow, Summer, CRNA, 100 mg at 03/27/21 1252   propofol (DIPRIVAN) 10 mg/mL bolus/IV push, , Intravenous, Anesthesia Intra-op, Lily Peer, Summer, CRNA, 20 mg at 03/27/21 1256   propofol (DIPRIVAN) 500 MG/50ML infusion, , Intravenous, Continuous PRN, Lily Peer, Summer, CRNA, Stopped at 03/27/21 1305  Medications Prior to Admission  Medication Sig Dispense Refill Last Dose   Dulaglutide (TRULICITY) A999333 [id number] SOPN Inject 0.75 mg into the skin once a week.   03/25/2021   fluticasone (FLONASE) 50 MCG/ACT nasal spray Place 2 sprays into both nostrils daily.   1 Past Month   glipiZIDE (GLUCOTROL XL) 2.5 MG 24 hr tablet Take 2.5 mg by mouth daily.   03/26/2021   ibuprofen (ADVIL) 200 MG tablet Take by mouth.   Past Week   omega-3 acid ethyl esters (LOVAZA) 1 g capsule Take 2 g by mouth daily.    Past Week   rosuvastatin (CRESTOR) 5 MG tablet 10 mg.   03/26/2021   sertraline (ZOLOFT) 50 MG tablet TK 1 T PO D WITH BRE   03/26/2021   ALPRAZolam (XANAX) 0.5 MG tablet Take 1 tablet by mouth 2 (two) times daily as needed. Reported on 05/22/2015 (Patient not taking: Reported on 03/27/2021)  0 Not Taking   buPROPion (WELLBUTRIN SR) 150 MG 12 hr tablet Take 1 tablet by mouth 2 (two) times  daily. (Patient not taking: Reported on 03/27/2021)  0 Not Taking   clonazePAM (KLONOPIN) 0.5 MG tablet TK 1 T PO D PRN (Patient not taking: Reported on 03/27/2021)   Not Taking   fenofibrate (TRICOR) 145 MG tablet Take 1 tablet by mouth daily. (Patient not taking: Reported on 03/27/2021)  1 Not Taking   metFORMIN (GLUCOPHAGE) 1000 MG tablet Take 1,000 mg by mouth 2 (two) times daily with a meal. (Patient not taking: Reported on 03/27/2021)  0 Not Taking   montelukast (SINGULAIR) 10 MG tablet Take by mouth. (Patient not taking: Reported on 03/27/2021)   Not Taking   tamsulosin (FLOMAX) 0.4 MG CAPS capsule Take 1 capsule (0.4 mg total) by mouth daily after breakfast. (Patient not taking: Reported on 03/27/2021) 30 capsule 0 Not Taking   traZODone (DESYREL) 100 MG tablet TK 1 T PO HS PRN (Patient not taking: Reported on 03/27/2021)   Not Taking     Allergies  Allergen Reactions   Astelin [Azelastine]      Past Medical History:  Diagnosis Date   Apnea, sleep 05/22/2015   Overview:  on CPAP    Diabetes ([place])    Diabetes mellitus without complication ([place]) [id number]   Elevated blood pressure reading without diagnosis of hypertension 05/22/2015   Flank pain 05/22/2015   Glenoid labral tear 08/05/2014   Heart disease    Hematuria    History of kidney stones    Microscopic hematuria 05/22/2015   Overview:  prior  history of renal stones. Renal Ultrasound 11/13 unremarkable    Personal history of gallstones "a year ago"  early 2016?   Urinary frequency     Review of systems:  Otherwise negative.    Physical Exam  Gen: Alert, oriented. Appears stated age.  HEENT: PERRLA. Lungs: No respiratory distress CV: RRR Abd: soft, benign, no masses Ext: No edema    Planned procedures: Proceed with colonoscopy. The patient understands the nature of the planned procedure, indications, risks, alternatives and potential complications including but not limited to bleeding, infection, perforation, damage  to internal organs and possible oversedation/side effects from anesthesia. The patient agrees and gives consent to proceed.  Please refer to procedure notes for findings, recommendations and patient disposition/instructions.     Regis Bill, MD [facility]

## 2021-03-27 NOTE — Anesthesia Procedure Notes (Signed)
Date/Time: 03/27/2021 12:49 PM Performed by: Joanette Gula, Celita Aron, CRNA Pre-anesthesia Checklist: Patient identified, Emergency Drugs available, Suction available, Patient being monitored and Timeout performed Patient Re-evaluated:Patient Re-evaluated prior to induction Oxygen Delivery Method: Nasal cannula Induction Type: IV induction

## 2021-03-27 NOTE — Anesthesia Postprocedure Evaluation (Signed)
Anesthesia Post Note  Patient: Gerald Myers  Procedure(s) Performed: COLONOSCOPY WITH PROPOFOL  Patient location during evaluation: PACU Anesthesia Type: General Level of consciousness: awake and alert Pain management: pain level controlled Vital Signs Assessment: post-procedure vital signs reviewed and stable Respiratory status: spontaneous breathing, nonlabored ventilation and respiratory function stable Cardiovascular status: blood pressure returned to baseline and stable Postop Assessment: no apparent nausea or vomiting Anesthetic complications: no   No notable events documented.   Last Vitals:  Vitals:   03/27/21 1030 03/27/21 1310  BP: (!) 147/97 109/75  Pulse: 72 80  Resp: 20 (!) 21  Temp: (!) 36.3 C (!) 36.1 C  SpO2: 98% 96%    Last Pain:  Vitals:   03/27/21 1320  TempSrc:   PainSc: 0-No pain                 Aurelio Brash Sanii Kukla

## 2021-03-27 NOTE — Transfer of Care (Signed)
Immediate Anesthesia Transfer of Care Note  Patient: Gerald Myers  Procedure(s) Performed: COLONOSCOPY WITH PROPOFOL  Patient Location: Endoscopy Unit  Anesthesia Type:General  Level of Consciousness: awake, alert  and oriented  Airway & Oxygen Therapy: Patient Spontanous Breathing  Post-op Assessment: Report given to RN and Post -op Vital signs reviewed and stable  Post vital signs: Reviewed and stable  Last Vitals:  Vitals Value Taken Time  BP 109/75 03/27/21 1311  Temp 36.1 C 03/27/21 1310  Pulse 73 03/27/21 1312  Resp 21 03/27/21 1312  SpO2 96 % 03/27/21 1312  Vitals shown include unvalidated device data.  Last Pain:  Vitals:   03/27/21 1310  TempSrc: Temporal  PainSc:          Complications: No notable events documented.

## 2021-03-27 NOTE — Op Note (Signed)
[facility] Patient Name: Gerald Myers Procedure Date: 03/27/2021 12:47 PM MRN: [id number] Account #: [id number] Date of Birth: 11-Mar-1971 Admit Type: Outpatient Age: 50 Room: [facility] ENDO ROOM 1 Gender: Male Note Status: Finalized Instrument Name: Jasper Riling [id number] Procedure:             Colonoscopy Indications:           Screening for colorectal malignant neoplasm Providers:             Andrey Farmer MD, MD Referring MD:          Ramonita Lab, MD (Referring MD) Medicines:             Monitored Anesthesia Care Complications:         No immediate complications. Procedure:             Pre-Anesthesia Assessment:                        - Prior to the procedure, a History and Physical was                         performed, and patient medications and allergies were                         reviewed. The patient is competent. The risks and                         benefits of the procedure and the sedation options and                         risks were discussed with the patient. All questions                         were answered and informed consent was obtained.                         Patient identification and proposed procedure were                         verified by the physician, the nurse, the anesthetist                         and the technician in the endoscopy suite. Mental                         Status Examination: alert and oriented. Airway                         Examination: normal oropharyngeal airway and neck                         mobility. Respiratory Examination: clear to                         auscultation. CV Examination: normal. Prophylactic                         Antibiotics: The patient does not require prophylactic  antibiotics. Prior Anticoagulants: The patient has                         taken no previous anticoagulant or antiplatelet                         agents. ASA Grade Assessment:  II - A patient with mild                         systemic disease. After reviewing the risks and                         benefits, the patient was deemed in satisfactory                         condition to undergo the procedure. The anesthesia                         plan was to use monitored anesthesia care (MAC).                         Immediately prior to administration of medications,                         the patient was re-assessed for adequacy to receive                         sedatives. The heart rate, respiratory rate, oxygen                         saturations, blood pressure, adequacy of pulmonary                         ventilation, and response to care were monitored                         throughout the procedure. The physical status of the                         patient was re-assessed after the procedure.                        After obtaining informed consent, the colonoscope was                         passed under direct vision. Throughout the procedure,                         the patient's blood pressure, pulse, and oxygen                         saturations were monitored continuously. The                         Colonoscope was introduced through the anus and                         advanced to the the cecum, identified by appendiceal  orifice and ileocecal valve. The colonoscopy was                         performed without difficulty. The patient tolerated                         the procedure well. The quality of the bowel                         preparation was good. Findings:      The perianal and digital rectal examinations were normal.      Multiple small-mouthed diverticula were found in the sigmoid colon and       descending colon.      Internal hemorrhoids were found during retroflexion. The hemorrhoids       were Grade I (internal hemorrhoids that do not prolapse).      The exam was otherwise without abnormality on direct and  retroflexion       views. Impression:            - Diverticulosis in the sigmoid colon and in the                         descending colon.                        - Internal hemorrhoids.                        - The examination was otherwise normal on direct and                         retroflexion views.                        - No specimens collected. Recommendation:        - Discharge patient to home.                        - Resume previous diet.                        - Continue present medications.                        - Repeat colonoscopy in 10 years for screening                         purposes.                        - Return to referring physician as previously                         scheduled. Diagnosis Code(s):     --- Professional ---                        Z12.11, Encounter for screening for malignant neoplasm                         of colon  K64.0, First degree hemorrhoids                        K57.30, Diverticulosis of large intestine without                         perforation or abscess without bleeding Andrey Farmer MD, MD 03/27/2021 1:10:16 PM Number of Addenda: 0 Note Initiated On: 03/27/2021 12:47 PM Scope Withdrawal Time: 0 hours 8 minutes 27 seconds  Total Procedure Duration: 0 hours 12 minutes 47 seconds  Estimated Blood Loss:  Estimated blood loss: none.      [facility]

## 2021-03-28 NOTE — Progress Notes (Signed)
Non-identifying Voicemail.  No Message Left. 

## 2021-03-30 ENCOUNTER — Encounter: Payer: Self-pay | Admitting: Gastroenterology

## 2022-05-12 ENCOUNTER — Other Ambulatory Visit: Payer: Self-pay

## 2022-05-12 ENCOUNTER — Emergency Department: Payer: Federal, State, Local not specified - PPO

## 2022-05-12 ENCOUNTER — Encounter: Payer: Self-pay | Admitting: *Deleted

## 2022-05-12 DIAGNOSIS — L03317 Cellulitis of buttock: Secondary | ICD-10-CM | POA: Diagnosis not present

## 2022-05-12 DIAGNOSIS — E1165 Type 2 diabetes mellitus with hyperglycemia: Secondary | ICD-10-CM | POA: Insufficient documentation

## 2022-05-12 DIAGNOSIS — Z20822 Contact with and (suspected) exposure to covid-19: Secondary | ICD-10-CM | POA: Insufficient documentation

## 2022-05-12 DIAGNOSIS — R509 Fever, unspecified: Secondary | ICD-10-CM | POA: Diagnosis present

## 2022-05-12 LAB — COMPREHENSIVE METABOLIC PANEL
ALT: 30 U/L (ref 0–44)
AST: 27 U/L (ref 15–41)
Albumin: 4.8 g/dL (ref 3.5–5.0)
Alkaline Phosphatase: 55 U/L (ref 38–126)
Anion gap: 8 (ref 5–15)
BUN: 13 mg/dL (ref 6–20)
CO2: 28 mmol/L (ref 22–32)
Calcium: 9.1 mg/dL (ref 8.9–10.3)
Chloride: 100 mmol/L (ref 98–111)
Creatinine, Ser: 0.96 mg/dL (ref 0.61–1.24)
GFR, Estimated: >60 mL/min (ref >60)
Glucose, Bld: 215 mg/dL — ABNORMAL HIGH (ref 70–99)
Potassium: 4.2 mmol/L (ref 3.5–5.1)
Sodium: 136 mmol/L (ref 135–145)
Total Bilirubin: 1.3 mg/dL — ABNORMAL HIGH (ref 0.3–1.2)
Total Protein: 8.3 g/dL — ABNORMAL HIGH (ref 6.5–8.1)

## 2022-05-12 LAB — CBC WITH DIFFERENTIAL/PLATELET
Abs Immature Granulocytes: 0.07 10*3/uL (ref 0.00–0.07)
Basophils Absolute: 0.1 10*3/uL (ref 0.0–0.1)
Basophils Relative: 0 %
Eosinophils Absolute: 0.1 10*3/uL (ref 0.0–0.5)
Eosinophils Relative: 1 %
HCT: 43 % (ref 39.0–52.0)
Hemoglobin: 15.3 g/dL (ref 13.0–17.0)
Immature Granulocytes: 0 %
Lymphocytes Relative: 16 %
Lymphs Abs: 2.6 10*3/uL (ref 0.7–4.0)
MCH: 32.3 pg (ref 26.0–34.0)
MCHC: 35.6 g/dL (ref 30.0–36.0)
MCV: 90.7 fL (ref 80.0–100.0)
Monocytes Absolute: 1.1 10*3/uL — ABNORMAL HIGH (ref 0.1–1.0)
Monocytes Relative: 7 %
Neutro Abs: 12 10*3/uL — ABNORMAL HIGH (ref 1.7–7.7)
Neutrophils Relative %: 76 %
Platelets: 213 10*3/uL (ref 150–400)
RBC: 4.74 MIL/uL (ref 4.22–5.81)
RDW: 12.5 % (ref 11.5–15.5)
WBC: 15.8 10*3/uL — ABNORMAL HIGH (ref 4.0–10.5)
nRBC: 0 % (ref 0.0–0.2)

## 2022-05-12 LAB — RESP PANEL BY RT-PCR (RSV, FLU A&B, COVID)  RVPGX2
Influenza A by PCR: NEGATIVE
Influenza B by PCR: NEGATIVE
Resp Syncytial Virus by PCR: NEGATIVE
SARS Coronavirus 2 by RT PCR: NEGATIVE

## 2022-05-12 LAB — LACTIC ACID, PLASMA
Lactic Acid, Venous: 2 mmol/L (ref 0.5–1.9)
Lactic Acid, Venous: 1.9 mmol/L (ref 0.5–1.9)

## 2022-05-12 LAB — BLOOD GAS, VENOUS
Acid-Base Excess: 2.1 mmol/L — ABNORMAL HIGH (ref 0.0–2.0)
Bicarbonate: 25.5 mmol/L (ref 20.0–28.0)
O2 Saturation: 85.2 %
Patient temperature: 37
pCO2, Ven: 35 mmHg — ABNORMAL LOW (ref 44–60)
pH, Ven: 7.47 — ABNORMAL HIGH (ref 7.25–7.43)
pO2, Ven: 52 mmHg — ABNORMAL HIGH (ref 32–45)

## 2022-05-12 LAB — CBG MONITORING, ED: Glucose-Capillary: 259 mg/dL — ABNORMAL HIGH (ref 70–99)

## 2022-05-12 LAB — BETA-HYDROXYBUTYRIC ACID: Beta-Hydroxybutyric Acid: 0.08 mmol/L (ref 0.05–0.27)

## 2022-05-12 MED ORDER — OXYCODONE-ACETAMINOPHEN 5-325 MG PO TABS
1.0000 | ORAL_TABLET | Freq: Once | ORAL | Status: AC
  Administered 2022-05-12: 1 via ORAL
  Filled 2022-05-12: qty 1

## 2022-05-12 MED ORDER — IOHEXOL 350 MG/ML SOLN
100.0000 mL | Freq: Once | INTRAVENOUS | Status: AC | PRN
  Administered 2022-05-12: 100 mL via INTRAVENOUS

## 2022-05-12 MED ORDER — IBUPROFEN 800 MG PO TABS
800.0000 mg | ORAL_TABLET | Freq: Once | ORAL | Status: AC
  Administered 2022-05-12: 800 mg via ORAL
  Filled 2022-05-12: qty 1

## 2022-05-12 NOTE — ED Triage Notes (Signed)
Pt has fever and a perianal  abscess  with drainage for 1 week.  Pt is diabetic  pt forgot to take diabetic meds last week.  Pt alert  speech clear.

## 2022-05-12 NOTE — ED Provider Triage Note (Signed)
Emergency Medicine Provider Triage Evaluation Note  Gerald Myers , a 51 y.o. male  was evaluated in triage.  Pt complains of perineal abscess, groin pain, fever. Diabetic, abscess to perineal region. Now fever. Has been draining and now drainage stopped and fever is present.  Review of Systems  Positive: Perineal pain, abscess, fever Negative: Cough, congestion, abd pain  Physical Exam  BP 126/82 (BP Location: Left Arm)   Pulse 96   Temp 99.1 F (37.3 C) (Oral)   Resp 20   Ht 5\' 7"  (1.702 m)   Wt 106.1 kg   SpO2 95%   BMI 36.65 kg/m  Gen:   Awake, no distress   Resp:  Normal effort  MSK:   Moves extremities without difficulty  Other:    Medical Decision Making  Medically screening exam initiated at 8:41 PM.  Appropriate orders placed.  THADDIUS MANES was informed that the remainder of the evaluation will be completed by another provider, this initial triage assessment does not replace that evaluation, and the importance of remaining in the ED until their evaluation is complete.  Patient with perineal abscess, fever, chills. Diabetic. CT, labs, urinalysis   Vara Guardian, PA-C 05/12/22 2045

## 2022-05-13 ENCOUNTER — Emergency Department
Admission: EM | Admit: 2022-05-13 | Discharge: 2022-05-13 | Disposition: A | Discharge status: Home / Self Care | Payer: Federal, State, Local not specified - PPO | Attending: Emergency Medicine | Admitting: Emergency Medicine

## 2022-05-13 DIAGNOSIS — E1165 Type 2 diabetes mellitus with hyperglycemia: Secondary | ICD-10-CM

## 2022-05-13 DIAGNOSIS — L03317 Cellulitis of buttock: Secondary | ICD-10-CM

## 2022-05-13 MED ORDER — CEPHALEXIN 500 MG PO CAPS
500.0000 mg | ORAL_CAPSULE | Freq: Once | ORAL | Status: AC
  Administered 2022-05-13: 500 mg via ORAL
  Filled 2022-05-13: qty 1

## 2022-05-13 MED ORDER — SULFAMETHOXAZOLE-TRIMETHOPRIM 800-160 MG PO TABS
1.0000 | ORAL_TABLET | Freq: Once | ORAL | Status: AC
  Administered 2022-05-13: 1 via ORAL
  Filled 2022-05-13: qty 1

## 2022-05-13 MED ORDER — SULFAMETHOXAZOLE-TRIMETHOPRIM 800-160 MG PO TABS: 1.0000 | ORAL_TABLET | Freq: Two times a day (BID) | ORAL | 0 refills | Status: AC

## 2022-05-13 MED ORDER — CEPHALEXIN 500 MG PO CAPS: 500.0000 mg | ORAL_CAPSULE | Freq: Three times a day (TID) | ORAL | 0 refills | Status: AC

## 2022-05-13 NOTE — ED Provider Notes (Signed)
[facility] Provider Note    Event Date/Time   First MD Initiated Contact with Patient 05/13/22 0138     (approximate)   History   Chief Complaint: Abscess and Fever   HPI  Gerald Myers is a 51 y.o. male with a history of diabetes, obesity who comes ED complaining of pain in his perineum with some swelling and redness over the past week.  He also started having purulent drainage from the area.  It stopped draining and he feels like that part is finished but he was having some fevers today.  Also, he forgot to take his diabetic medications last week and was worried about elevated blood sugars.  Denies chest pain shortness of breath or abdominal pain.  No vomiting.     Physical Exam   Triage Vital Signs: ED Triage Vitals  Enc Vitals Group     BP 05/12/22 2029 126/82     Pulse Rate 05/12/22 2029 96     Resp 05/12/22 2029 20     Temp 05/12/22 2029 99.1 F (37.3 C)     Temp Source 05/12/22 2029 Oral     SpO2 05/12/22 2029 95 %     Weight 05/12/22 2028 230 lb (104.3 kg)     Height 05/12/22 2028 5\' 7"  (1.702 m)     Head Circumference --      Peak Flow --      Pain Score 05/12/22 2040 10     Pain Loc --      Pain Edu? --      Excl. in GC? --     Most recent vital signs: Vitals:   05/12/22 2029 05/12/22 2153  BP: 126/82 113/68  Pulse: 96 92  Resp: 20 16  Temp: 99.1 F (37.3 C) (!) 102.6 F (39.2 C)  SpO2: 95% 93%    General: Awake, no distress.  CV:  Good peripheral perfusion.  Regular rate rhythm Resp:  Normal effort.  Clear to auscultation Abd:  No distention.  Soft nontender Other:  Gluteal exam reveals a 2 x 4 cm area of erythema induration just left of midline.  There is a scant amount of spontaneous purulent drainage from the area.  Does not extend to the anus.  No crepitus.  No streaking.  ED Results / Procedures / Treatments   Labs (all labs ordered are listed, but only abnormal results are displayed) Labs Reviewed   COMPREHENSIVE METABOLIC PANEL - Abnormal; Notable for the following components:      Result Value   Glucose, Bld 215 (*)    Total Protein 8.3 (*)    Total Bilirubin 1.3 (*)    All other components within normal limits  CBC WITH DIFFERENTIAL/PLATELET - Abnormal; Notable for the following components:   WBC 15.8 (*)    Neutro Abs 12.0 (*)    Monocytes Absolute 1.1 (*)    All other components within normal limits  LACTIC ACID, PLASMA - Abnormal; Notable for the following components:   Lactic Acid, Venous 2.0 (*)    All other components within normal limits  BLOOD GAS, VENOUS - Abnormal; Notable for the following components:   pH, Ven 7.47 (*)    pCO2, Ven 35 (*)    pO2, Ven 52 (*)    Acid-Base Excess 2.1 (*)    All other components within normal limits  CBG MONITORING, ED - Abnormal; Notable for the following components:   Glucose-Capillary 259 (*)    All other components  within normal limits  RESP PANEL BY RT-PCR (RSV, FLU A&B, COVID)  [id number]  CULTURE, BLOOD (ROUTINE X 2)  CULTURE, BLOOD (ROUTINE X 2)  LACTIC ACID, PLASMA  BETA-HYDROXYBUTYRIC ACID  URINALYSIS, ROUTINE W REFLEX MICROSCOPIC     EKG    RADIOLOGY CT abdomen pelvis interpreted by me, negative for abscess.  Radiology report reviewed   PROCEDURES:  Procedures   MEDICATIONS ORDERED IN ED: Medications  sulfamethoxazole-trimethoprim (BACTRIM DS) 800-160 MG per tablet 1 tablet (has no administration in time range)  cephALEXin (KEFLEX) capsule 500 mg (has no administration in time range)  oxyCODONE-acetaminophen (PERCOCET/ROXICET) 5-325 MG per tablet 1 tablet (1 tablet Oral Given 05/12/22 2045)  ibuprofen (ADVIL) tablet 800 mg (800 mg Oral Given 05/12/22 2208)  iohexol (OMNIPAQUE) 350 MG/ML injection 100 mL (100 mLs Intravenous Contrast Given 05/12/22 2211)     IMPRESSION / MDM / ASSESSMENT AND PLAN / ED COURSE  I reviewed the triage vital signs and the nursing notes.                               Differential diagnosis includes, but is not limited to, perineal abscess, perirectal abscess, fistula, doubt necrotizing fasciitis  Patient's presentation is most consistent with acute presentation with potential threat to life or bodily function.  Patient presents with cellulitis of the perineum.  He is diabetic, labs are okay without evidence of metabolic acidosis.  Lipase essentially normal.  He has a fever of 102 which is response to the cellulitis, but he is nontoxic on exam and not septic.  Will start him on Keflex and Bactrim, follow-up with PCP.       FINAL CLINICAL IMPRESSION(S) / ED DIAGNOSES   Final diagnoses:  Cellulitis of buttock  Type 2 diabetes mellitus with hyperglycemia, without long-term current use of insulin (HCC)     Rx / DC Orders   ED Discharge Orders          Ordered    cephALEXin (KEFLEX) 500 MG capsule  3 times daily        05/13/22 0213    sulfamethoxazole-trimethoprim (BACTRIM DS) 800-160 MG tablet  2 times daily        05/13/22 7035             Note:  This document was prepared using Dragon voice recognition software and may include unintentional dictation errors.   Sharman Cheek, MD 05/13/22 [id number]

## 2022-05-17 LAB — CULTURE, BLOOD (ROUTINE X 2)
Culture: NO GROWTH
Culture: NO GROWTH
Special Requests: ADEQUATE
